# Patient Record
Sex: Female | Born: 1970 | Race: White | Hispanic: No | Marital: Married
Health system: Southern US, Community
[De-identification: ages and names within clinical notes are randomized; demographics above are authoritative.]

## PROBLEM LIST (undated history)

## (undated) NOTE — *Deleted (*Deleted)
  Problem: Education: Goal: Knowledge of risk factors and measures for prevention of condition will improve Outcome: Progressing   Problem: Education: Goal: Knowledge of risk factors and measures for prevention of condition will improve Outcome: Progressing   

---

## 1997-10-23 ENCOUNTER — Emergency Department (HOSPITAL_COMMUNITY): Admission: EM | Admit: 1997-10-23 | Discharge: 1997-10-23 | Payer: Self-pay | Admitting: Emergency Medicine

## 1998-12-28 ENCOUNTER — Emergency Department (HOSPITAL_COMMUNITY): Admission: EM | Admit: 1998-12-28 | Discharge: 1998-12-28 | Payer: Self-pay | Admitting: Emergency Medicine

## 2012-09-04 DIAGNOSIS — D649 Anemia, unspecified: Secondary | ICD-10-CM | POA: Insufficient documentation

## 2012-09-04 DIAGNOSIS — S42213A Unspecified displaced fracture of surgical neck of unspecified humerus, initial encounter for closed fracture: Secondary | ICD-10-CM | POA: Insufficient documentation

## 2012-09-04 DIAGNOSIS — G5600 Carpal tunnel syndrome, unspecified upper limb: Secondary | ICD-10-CM | POA: Insufficient documentation

## 2012-09-04 DIAGNOSIS — N946 Dysmenorrhea, unspecified: Secondary | ICD-10-CM | POA: Insufficient documentation

## 2012-09-04 DIAGNOSIS — S92919A Unspecified fracture of unspecified toe(s), initial encounter for closed fracture: Secondary | ICD-10-CM | POA: Insufficient documentation

## 2012-09-04 DIAGNOSIS — N201 Calculus of ureter: Secondary | ICD-10-CM | POA: Insufficient documentation

## 2019-08-17 ENCOUNTER — Ambulatory Visit: Payer: Medicaid Other | Admitting: Sports Medicine

## 2019-08-17 ENCOUNTER — Other Ambulatory Visit: Payer: Self-pay

## 2019-08-17 ENCOUNTER — Other Ambulatory Visit: Payer: Self-pay | Admitting: Sports Medicine

## 2019-08-17 ENCOUNTER — Encounter: Payer: Self-pay | Admitting: Sports Medicine

## 2019-08-17 DIAGNOSIS — S92102A Unspecified fracture of left talus, initial encounter for closed fracture: Secondary | ICD-10-CM

## 2019-08-17 DIAGNOSIS — M25572 Pain in left ankle and joints of left foot: Secondary | ICD-10-CM

## 2019-08-17 DIAGNOSIS — M79672 Pain in left foot: Secondary | ICD-10-CM

## 2019-08-17 DIAGNOSIS — R58 Hemorrhage, not elsewhere classified: Secondary | ICD-10-CM

## 2019-08-17 DIAGNOSIS — S92112A Displaced fracture of neck of left talus, initial encounter for closed fracture: Secondary | ICD-10-CM | POA: Diagnosis not present

## 2019-08-17 DIAGNOSIS — M79671 Pain in right foot: Secondary | ICD-10-CM

## 2019-08-17 DIAGNOSIS — R2681 Unsteadiness on feet: Secondary | ICD-10-CM

## 2019-08-17 MED ORDER — OXYCODONE-ACETAMINOPHEN 10-325 MG PO TABS: 1.0000 | ORAL_TABLET | Freq: Three times a day (TID) | ORAL | 0 refills | Status: DC | PRN

## 2019-08-17 NOTE — Progress Notes (Signed)
Subjective: Brianna Velasquez is a 49 y.o. female patient who presents to office for evaluation of Left ankle/foot pain. Patient complains of progressive pain especially over the last few days after falling on last Friday coming down a ramp, sine has been 10/10 sharp pain. Patient has tried icing, tramadol rx by PCP and icing with no relief in symptoms. Pain is worse at night, unable to sleep. Patient denies any other pedal complaints.   Review of Systems  All other systems reviewed and are negative.    Patient Active Problem List   Diagnosis Date Noted  . Anemia 09/04/2012  . Carpal tunnel syndrome 09/04/2012  . Closed fracture of phalanx of foot 09/04/2012  . Closed fracture of surgical neck of humerus 09/04/2012  . Dysmenorrhea 09/04/2012  . Morbid obesity (HCC) 09/04/2012  . Ureteric stone 09/04/2012    Current Outpatient Medications on File Prior to Visit  Medication Sig Dispense Refill  . ASPIRIN 81 PO Take by mouth.    Marland Kitchen atorvastatin (LIPITOR) 20 MG tablet Take 20 mg by mouth daily.    Marland Kitchen escitalopram (LEXAPRO) 10 MG tablet Take 10 mg by mouth daily.    Marland Kitchen ezetimibe (ZETIA) 10 MG tablet Take 10 mg by mouth daily.    Marland Kitchen ibuprofen (ADVIL) 800 MG tablet Take 800 mg by mouth every 8 (eight) hours as needed.    Marland Kitchen levothyroxine (SYNTHROID) 88 MCG tablet Take 88 mcg by mouth daily before breakfast.    . traMADol (ULTRAM) 50 MG tablet Take by mouth every 6 (six) hours as needed.     No current facility-administered medications on file prior to visit.    Not on File  Objective:  General: Alert and oriented x3 in no acute distress  Dermatology: No open lesions bilateral lower extremities, no webspace macerations, +ecchymosis left foot and ankle, all nails x 10 are well manicured.  Vascular: Focal edema noted to left foot/ankle. Dorsalis Pedis and Posterior Tibial pedal pulses 1/4, Capillary Fill Time 3 seconds,(+) pedal hair growth bilateral, Temperature gradient within normal  limits.  Neurology: Gross sensation intact via light touch bilateral, Protective sensation intact with Phoebe Perch Monofilament to all pedal sites. (-)Tinels sign left foot.   Musculoskeletal: There is tenderness with palpation at dorsal ankle on Left foot, There is pain with range of motion at left ankle. Strength difficult to test due to pain.  Gait: Cane assisted, Antalgic gait  X-ray left foot [facility] 07/19/2019 reveals healing first proximal phalangeal fracture and stable calcaneal spur compared to x-rays from the year prior Xray left ankle 08-15-19 avulsion fleck dorsal talus    Assessment and Plan: Problem List Items Addressed This Visit    None    Visit Diagnoses    Closed nondisplaced fracture of left talus, unspecified portion of talus, initial encounter    -  Primary   Ecchymosis       Acute left ankle pain       Left foot pain       Gait instability          -Complete examination performed -Xrays reports reviewed -Discussed treatment options for fracture; risks, alternatives, and benefits explained. -Engineer, mining to keep intact to left for 5 days -Dispensed CAM walker to patient to wear at all times and instructed on use -Recommend protection, rest, ice, elevation daily until symptoms improve -Rx Percocet to take since tramadol not effective -Continue with motrin in between doses of pain medication -Patient to return to office  in 2-3 weeks for serial x-rays to assess healing  or sooner if condition worsens.  Landis Martins, DPM

## 2019-08-24 ENCOUNTER — Other Ambulatory Visit: Payer: Self-pay | Admitting: Sports Medicine

## 2019-08-24 MED ORDER — OXYCODONE-ACETAMINOPHEN 10-325 MG PO TABS: 1.0000 | ORAL_TABLET | Freq: Three times a day (TID) | ORAL | 0 refills | Status: DC | PRN

## 2019-08-24 NOTE — Progress Notes (Signed)
Refilled pain medication for fracture -Dr. Kathie Rhodes

## 2019-08-30 ENCOUNTER — Other Ambulatory Visit: Payer: Self-pay

## 2019-08-30 ENCOUNTER — Ambulatory Visit (INDEPENDENT_AMBULATORY_CARE_PROVIDER_SITE_OTHER): Payer: Self-pay

## 2019-08-30 ENCOUNTER — Encounter: Payer: Self-pay | Admitting: Sports Medicine

## 2019-08-30 ENCOUNTER — Ambulatory Visit: Payer: Medicaid Other | Admitting: Sports Medicine

## 2019-08-30 ENCOUNTER — Other Ambulatory Visit: Payer: Self-pay | Admitting: Sports Medicine

## 2019-08-30 DIAGNOSIS — S86312S Strain of muscle(s) and tendon(s) of peroneal muscle group at lower leg level, left leg, sequela: Secondary | ICD-10-CM

## 2019-08-30 DIAGNOSIS — M79672 Pain in left foot: Secondary | ICD-10-CM

## 2019-08-30 DIAGNOSIS — S92102D Unspecified fracture of left talus, subsequent encounter for fracture with routine healing: Secondary | ICD-10-CM

## 2019-08-30 DIAGNOSIS — M779 Enthesopathy, unspecified: Secondary | ICD-10-CM

## 2019-08-30 DIAGNOSIS — R58 Hemorrhage, not elsewhere classified: Secondary | ICD-10-CM

## 2019-08-30 DIAGNOSIS — R2681 Unsteadiness on feet: Secondary | ICD-10-CM

## 2019-08-30 DIAGNOSIS — M25572 Pain in left ankle and joints of left foot: Secondary | ICD-10-CM

## 2019-08-30 MED ORDER — OXYCODONE-ACETAMINOPHEN 10-325 MG PO TABS: 1.0000 | ORAL_TABLET | Freq: Three times a day (TID) | ORAL | 0 refills | Status: DC | PRN

## 2019-08-30 NOTE — Progress Notes (Signed)
Subjective: Brianna Velasquez is a 49 y.o. female patient who presents to office for follow up evaluation of Left ankle/foot pain. Patient complains of continued pain in the ankle on the outside of the foot and at the top of the ankle. Patient reports that pain is sharp constant pain that is constant even with pain medication, motrin, and boot and ace wrap.  Reports that swelling is better but pain is not.  Patient denies any other pedal complaints.   Patient Active Problem List   Diagnosis Date Noted  . Anemia 09/04/2012  . Carpal tunnel syndrome 09/04/2012  . Closed fracture of phalanx of foot 09/04/2012  . Closed fracture of surgical neck of humerus 09/04/2012  . Dysmenorrhea 09/04/2012  . Morbid obesity (HCC) 09/04/2012  . Ureteric stone 09/04/2012    Current Outpatient Medications on File Prior to Visit  Medication Sig Dispense Refill  . ASPIRIN 81 PO Take by mouth.    Marland Kitchen atorvastatin (LIPITOR) 20 MG tablet Take 20 mg by mouth daily.    Marland Kitchen escitalopram (LEXAPRO) 10 MG tablet Take 10 mg by mouth daily.    Marland Kitchen ezetimibe (ZETIA) 10 MG tablet Take 10 mg by mouth daily.    Marland Kitchen ibuprofen (ADVIL) 800 MG tablet Take 800 mg by mouth every 8 (eight) hours as needed.    Marland Kitchen levothyroxine (SYNTHROID) 88 MCG tablet Take 88 mcg by mouth daily before breakfast.    . oxyCODONE-acetaminophen (PERCOCET) 10-325 MG tablet Take 1 tablet by mouth every 8 (eight) hours as needed for up to 7 days for pain. 21 tablet 0  . traMADol (ULTRAM) 50 MG tablet Take by mouth every 6 (six) hours as needed.     No current facility-administered medications on file prior to visit.    Not on File  Objective:  General: Alert and oriented x3 in no acute distress  Dermatology: No open lesions bilateral lower extremities, no webspace macerations, + decreased ecchymosis left foot and ankle, all nails x 10 are well manicured.  Vascular: Focal edema noted to left foot/ankle. Dorsalis Pedis and Posterior Tibial pedal pulses 1/4,  Capillary Fill Time 3 seconds,(+) pedal hair growth bilateral, Temperature gradient within normal limits.  Neurology: Gross sensation intact via light touch bilateral, Protective sensation intact with Phoebe Perch Monofilament to all pedal sites. (-)Tinels sign left foot.   Musculoskeletal: There is tenderness with palpation at dorsal and lateral ankle on Left foot, There is pain with range of motion at left ankle. Strength difficult to test due to pain and soft tissue swelling.  Gait: Cane assisted, Antalgic gait  Xray left ankle avulsion fleck dorsal talus, significant soft tissue swelling especially lateral ankle.  No other acute findings.     Assessment and Plan: Problem List Items Addressed This Visit    None    Visit Diagnoses    Closed nondisplaced fracture of left talus with routine healing, unspecified fracture morphology, subsequent encounter    -  Primary   Peroneal tendon tear, left, sequela       Ecchymosis       Acute left ankle pain       Left foot pain       Gait instability          -Complete examination performed -Xrays reviewed -Re-Discussed treatment options for fracture with now even possible peroneal tendon involvement versus tear; risks, alternatives, and benefits explained. -Ordered MRI to rule out tear in the setting of constant pain with history of fall -Continue with cam boot  and cane -Dispensed surgitube compression sleeve for edema control -Refill Percocet for patient to take for more severe episodes of pain to pick up on tomorrow -Continue with motrin in between doses of pain medication -Patient to return to office after MRI or sooner if problems or issues arise.  Landis Martins, DPM

## 2019-08-31 ENCOUNTER — Telehealth: Payer: Self-pay

## 2019-08-31 ENCOUNTER — Telehealth: Payer: Self-pay | Admitting: *Deleted

## 2019-08-31 DIAGNOSIS — M25572 Pain in left ankle and joints of left foot: Secondary | ICD-10-CM

## 2019-08-31 DIAGNOSIS — S92102D Unspecified fracture of left talus, subsequent encounter for fracture with routine healing: Secondary | ICD-10-CM

## 2019-08-31 DIAGNOSIS — R2681 Unsteadiness on feet: Secondary | ICD-10-CM

## 2019-08-31 DIAGNOSIS — S86312S Strain of muscle(s) and tendon(s) of peroneal muscle group at lower leg level, left leg, sequela: Secondary | ICD-10-CM

## 2019-08-31 NOTE — Telephone Encounter (Signed)
-----   Message from [place], [place] sent at 08/30/2019 10:24 PM EDT ----- Regarding: MRI Left anlke R/o tear at peroneal tendon and evaluate extent of talus fracture, continued pain in ankle after a fall injury

## 2019-08-31 NOTE — Telephone Encounter (Signed)
Same chip fracture at the front of the ankle that she had before and some swelling of the ankle

## 2019-08-31 NOTE — Telephone Encounter (Signed)
Pt called and LVM  Requesting to know what exactly did the doctor saw on her xrays taken yesterday afternoon. Pt states she forgot what the Dr. Charline Bills.

## 2019-09-01 NOTE — Telephone Encounter (Signed)
Pt was advised that she has the same chip fx at the ankle and swelling and that it's too early to tel if it's healing, that her MRI will confirm if it's healing. Pt stated understanding

## 2019-09-01 NOTE — Telephone Encounter (Signed)
[employer] - Medicaid requires clinicals for review prior to pre-cert for MRI 79987 left ankle, Case:  [id number]. Faxed orders, clinicals and demographics to [employer].

## 2019-09-06 ENCOUNTER — Other Ambulatory Visit: Payer: Self-pay | Admitting: Sports Medicine

## 2019-09-06 DIAGNOSIS — S92102D Unspecified fracture of left talus, subsequent encounter for fracture with routine healing: Secondary | ICD-10-CM

## 2019-09-06 MED ORDER — OXYCODONE-ACETAMINOPHEN 10-325 MG PO TABS: 1.0000 | ORAL_TABLET | Freq: Three times a day (TID) | ORAL | 0 refills | Status: AC | PRN

## 2019-09-06 NOTE — Progress Notes (Signed)
Refilled pain medication for pain related to facture

## 2019-09-14 ENCOUNTER — Telehealth: Payer: Self-pay | Admitting: Sports Medicine

## 2019-09-14 ENCOUNTER — Other Ambulatory Visit: Payer: Self-pay | Admitting: Sports Medicine

## 2019-09-14 DIAGNOSIS — S92102D Unspecified fracture of left talus, subsequent encounter for fracture with routine healing: Secondary | ICD-10-CM

## 2019-09-14 MED ORDER — OXYCODONE-ACETAMINOPHEN 10-325 MG PO TABS: 1.0000 | ORAL_TABLET | Freq: Three times a day (TID) | ORAL | 0 refills | Status: AC | PRN

## 2019-09-14 NOTE — Telephone Encounter (Signed)
Sent pain medication but please follow up on her MRI. She hasn't heard anything yet Thanks Dr. Marylene Land

## 2019-09-14 NOTE — Telephone Encounter (Signed)
Pt req refill for oxycodone [facility] [place].  Pt has not heard back about MRI scheduling yet.

## 2019-09-14 NOTE — Progress Notes (Signed)
Refilled Percocet  -Dr Marylene Land

## 2019-09-15 NOTE — Telephone Encounter (Signed)
[employer] - MEDICAID AUTHORIZATION:  [id number] FOR MRI LEFT [id number], VALID: 09/01/2019 END DATE: 02/28/2020 FOR CASE:  [id number]. [facility] Antony Contras scheduled [id number] left MRI for 09/19/2019 arrive at 7:15am for 7:30am imaging. Faxed to [facility].

## 2019-09-15 NOTE — Telephone Encounter (Signed)
Unable to leave a message home phone mail box is not set up. Left message with pt's mtr to call for her appt.

## 2019-09-18 NOTE — Telephone Encounter (Signed)
I informed pt of [facility] appt 09/19/2019.

## 2019-09-20 ENCOUNTER — Other Ambulatory Visit: Payer: Self-pay | Admitting: Sports Medicine

## 2019-09-20 ENCOUNTER — Telehealth: Payer: Self-pay | Admitting: Sports Medicine

## 2019-09-20 DIAGNOSIS — S92102D Unspecified fracture of left talus, subsequent encounter for fracture with routine healing: Secondary | ICD-10-CM

## 2019-09-20 MED ORDER — OXYCODONE-ACETAMINOPHEN 10-325 MG PO TABS: 1.0000 | ORAL_TABLET | Freq: Three times a day (TID) | ORAL | 0 refills | Status: AC | PRN

## 2019-09-20 NOTE — Progress Notes (Signed)
Refilled pain medication

## 2019-09-20 NOTE — Telephone Encounter (Signed)
Pt called and would like a refill on her oxyCODONE-acetaminophen. Please advise.

## 2019-09-20 NOTE — Telephone Encounter (Signed)
FYI Refill has been sent to her pharmacy  -Dr. Kathie Rhodes

## 2019-09-26 ENCOUNTER — Telehealth: Payer: Self-pay | Admitting: *Deleted

## 2019-09-26 NOTE — Telephone Encounter (Signed)
Patient is requesting her MRI results that were completed 1 week ago.  Please call.

## 2019-09-28 ENCOUNTER — Ambulatory Visit: Payer: Medicaid Other | Admitting: Sports Medicine

## 2019-09-28 ENCOUNTER — Encounter: Payer: Self-pay | Admitting: Sports Medicine

## 2019-09-28 ENCOUNTER — Other Ambulatory Visit: Payer: Self-pay

## 2019-09-28 DIAGNOSIS — M79672 Pain in left foot: Secondary | ICD-10-CM

## 2019-09-28 DIAGNOSIS — S92102D Unspecified fracture of left talus, subsequent encounter for fracture with routine healing: Secondary | ICD-10-CM

## 2019-09-28 DIAGNOSIS — M722 Plantar fascial fibromatosis: Secondary | ICD-10-CM | POA: Diagnosis not present

## 2019-09-28 DIAGNOSIS — M779 Enthesopathy, unspecified: Secondary | ICD-10-CM | POA: Diagnosis not present

## 2019-09-28 DIAGNOSIS — S86312S Strain of muscle(s) and tendon(s) of peroneal muscle group at lower leg level, left leg, sequela: Secondary | ICD-10-CM

## 2019-09-28 DIAGNOSIS — M25572 Pain in left ankle and joints of left foot: Secondary | ICD-10-CM

## 2019-09-28 MED ORDER — PREDNISONE 10 MG (21) PO TBPK
ORAL_TABLET | ORAL | 0 refills | Status: DC
Start: 2019-09-28 — End: 2019-10-12

## 2019-09-28 MED ORDER — OXYCODONE-ACETAMINOPHEN 10-325 MG PO TABS: 1.0000 | ORAL_TABLET | Freq: Three times a day (TID) | ORAL | 0 refills | Status: AC | PRN

## 2019-09-28 NOTE — Progress Notes (Signed)
Subjective: Brianna Velasquez is a 49 y.o. female patient who presents to office for follow up evaluation of Left ankle/foot pain. Patient complains of continued pain in the ankle and foot but does seem like it is getting a little better pain is worse still at night 7 out of 10 and after standing for a long period of time can stand about an hour before she has swelling and pain that is relieved with her Percocet I think and elevating.  Patient denies any other pedal complaints.   Patient Active Problem List   Diagnosis Date Noted  . Anemia 09/04/2012  . Carpal tunnel syndrome 09/04/2012  . Closed fracture of phalanx of foot 09/04/2012  . Closed fracture of surgical neck of humerus 09/04/2012  . Dysmenorrhea 09/04/2012  . Morbid obesity ([place]) 09/04/2012  . Ureteric stone 09/04/2012    Current Outpatient Medications on File Prior to Visit  Medication Sig Dispense Refill  . ASPIRIN 81 PO Take by mouth.    Marland Kitchen atorvastatin (LIPITOR) 20 MG tablet Take 20 mg by mouth daily.    Marland Kitchen escitalopram (LEXAPRO) 10 MG tablet Take 10 mg by mouth daily.    Marland Kitchen ezetimibe (ZETIA) 10 MG tablet Take 10 mg by mouth daily.    Marland Kitchen ibuprofen (ADVIL) 800 MG tablet Take 800 mg by mouth every 8 (eight) hours as needed.    Marland Kitchen levothyroxine (SYNTHROID) 88 MCG tablet Take 88 mcg by mouth daily before breakfast.    . traMADol (ULTRAM) 50 MG tablet Take by mouth every 6 (six) hours as needed.     No current facility-administered medications on file prior to visit.    Not on File  Objective:  General: Alert and oriented x3 in no acute distress  Dermatology: No open lesions bilateral lower extremities, no webspace macerations, all nails x 10 are well manicured.  Vascular: Decreased edema noted to left foot/ankle. Dorsalis Pedis and Posterior Tibial pedal pulses 1/4, Capillary Fill Time 3 seconds,(+) pedal hair growth bilateral, Temperature gradient within normal limits.  Neurology: Gross sensation intact via light touch  bilateral, Protective sensation intact with Thornell Mule Monofilament to all pedal sites. (-)Tinels sign left foot.   Musculoskeletal: There is decreased tenderness with palpation at dorsal and lateral ankle on Left foot, there is minimal pain to palpation at medial lateral ankle and posterior heel at Achilles insertion, there is decreased pain with range of motion at left ankle.  Gait: Cane assisted, Antalgic gait with cam boot  MRI reviewed consistent with old or healed avulsion fracture at the talus I discussed this case with the radiologist who confirmed that on her MRI that there was no marrow edema at the ankle at the level of the talus that suggests any ongoing healing of fracture at this area appears to have previous avulsion fracture that has healed however there is areas of significant tendinitis and partial tearing no full-thickness tearing at Achilles tendon and at peroneal tendon course on the left foot and ankle.  Assessment and Plan: Problem List Items Addressed This Visit    None    Visit Diagnoses    Closed nondisplaced fracture of left talus with routine healing, unspecified fracture morphology, subsequent encounter    -  Primary   Peroneal tendon tear, left, sequela       Tendinitis       Plantar fasciitis       Left foot pain       Acute left ankle pain          -  Complete examination performed -MRI results reviewed -Re-Discussed treatment options  -Advised patient to continue with cam boot -Dispensed prednisone for patient to take for inflammation along tendon areas -Dispensed surgitube compression sleeve for edema control -Refill Percocet for patient to have for severe pain patient will be out over the weekend so the earliest patient can pick up the medication is on Sunday -Continue with motrin in between doses of pain medication as needed except when she is on the prednisone but she use Tylenol -Patient to return to office in 2 weeks or sooner if problems or  issues arise.  Advised patient in 2 weeks if she is feeling better may consider transitioning her out of the cam boot  Asencion Islam, DPM

## 2019-10-04 ENCOUNTER — Encounter: Payer: Self-pay | Admitting: Sports Medicine

## 2019-10-09 ENCOUNTER — Other Ambulatory Visit: Payer: Self-pay | Admitting: Sports Medicine

## 2019-10-09 ENCOUNTER — Telehealth: Payer: Self-pay | Admitting: Urology

## 2019-10-09 MED ORDER — OXYCODONE-ACETAMINOPHEN 10-325 MG PO TABS: 1.0000 | ORAL_TABLET | Freq: Three times a day (TID) | ORAL | 0 refills | Status: DC | PRN

## 2019-10-09 NOTE — Telephone Encounter (Signed)
Sent!

## 2019-10-09 NOTE — Telephone Encounter (Signed)
Pt would like a refill on pain medicine. Please advise. Thanks

## 2019-10-09 NOTE — Progress Notes (Signed)
Refilled pain medication

## 2019-10-12 ENCOUNTER — Other Ambulatory Visit: Payer: Self-pay

## 2019-10-12 ENCOUNTER — Encounter: Payer: Self-pay | Admitting: Sports Medicine

## 2019-10-12 ENCOUNTER — Ambulatory Visit: Payer: Medicaid Other | Admitting: Sports Medicine

## 2019-10-12 DIAGNOSIS — M25572 Pain in left ankle and joints of left foot: Secondary | ICD-10-CM

## 2019-10-12 DIAGNOSIS — M7751 Other enthesopathy of right foot: Secondary | ICD-10-CM

## 2019-10-12 DIAGNOSIS — S92102D Unspecified fracture of left talus, subsequent encounter for fracture with routine healing: Secondary | ICD-10-CM

## 2019-10-12 DIAGNOSIS — M722 Plantar fascial fibromatosis: Secondary | ICD-10-CM

## 2019-10-12 DIAGNOSIS — M79672 Pain in left foot: Secondary | ICD-10-CM

## 2019-10-12 DIAGNOSIS — M779 Enthesopathy, unspecified: Secondary | ICD-10-CM

## 2019-10-12 MED ORDER — TRIAMCINOLONE ACETONIDE 10 MG/ML IJ SUSP
10.0000 mg | Freq: Once | INTRAMUSCULAR | Status: AC
  Administered 2019-10-12: 10 mg

## 2019-10-12 MED ORDER — OXYCODONE-ACETAMINOPHEN 10-325 MG PO TABS: 1.0000 | ORAL_TABLET | Freq: Three times a day (TID) | ORAL | 0 refills | Status: AC | PRN

## 2019-10-12 NOTE — Progress Notes (Signed)
Subjective: Brianna Velasquez is a 49 y.o. female patient who presents to office for follow up evaluation of Left ankle/foot pain. Patient complains of continued pain in the ankle and foot worse at the back and the sides of the ankle reports that the swelling is good unless she is on it with still pain worse at night reports that none of the medicines have helped but the boot seems to help the most as well as elevating and taking her pain medicine.  Patient denies any other pedal complaints.   Patient Active Problem List   Diagnosis Date Noted  . Anemia 09/04/2012  . Carpal tunnel syndrome 09/04/2012  . Closed fracture of phalanx of foot 09/04/2012  . Closed fracture of surgical neck of humerus 09/04/2012  . Dysmenorrhea 09/04/2012  . Morbid obesity (HCC) 09/04/2012  . Ureteric stone 09/04/2012    Current Outpatient Medications on File Prior to Visit  Medication Sig Dispense Refill  . ASPIRIN 81 PO Take by mouth.    Marland Kitchen atorvastatin (LIPITOR) 20 MG tablet Take 20 mg by mouth daily.    Marland Kitchen escitalopram (LEXAPRO) 10 MG tablet Take 10 mg by mouth daily.    Marland Kitchen ezetimibe (ZETIA) 10 MG tablet Take 10 mg by mouth daily.    . hydrOXYzine (ATARAX/VISTARIL) 25 MG tablet Take 25 mg by mouth daily as needed.    Marland Kitchen ibuprofen (ADVIL) 800 MG tablet Take 800 mg by mouth every 8 (eight) hours as needed.    Marland Kitchen levothyroxine (SYNTHROID) 88 MCG tablet Take 88 mcg by mouth daily before breakfast.    . traMADol (ULTRAM) 50 MG tablet Take by mouth every 6 (six) hours as needed.     No current facility-administered medications on file prior to visit.    Not on File  Objective:  General: Alert and oriented x3 in no acute distress  Dermatology: No open lesions bilateral lower extremities, no webspace macerations, all nails x 10 are well manicured.  Vascular: Decreased edema noted to left foot/ankle. Dorsalis Pedis and Posterior Tibial pedal pulses 1/4, Capillary Fill Time 3 seconds,(+) pedal hair growth bilateral,  Temperature gradient within normal limits.  Neurology: Gross sensation intact via light touch bilateral, Protective sensation intact with Phoebe Perch Monofilament to all pedal sites. (-)Tinels sign left foot.   Musculoskeletal: There is decreased tenderness with palpation at dorsal and lateral ankle on Left foot, there is pain to palpation at medial lateral ankle and posterior heel at Achilles insertion worse today at the lateral foot and ankle, there is decreased pain with range of motion at left ankle.  Gait: Cane assisted, Antalgic gait with cam boot  Assessment and Plan: Problem List Items Addressed This Visit    None    Visit Diagnoses    Tendinitis    -  Primary   Relevant Medications   triamcinolone acetonide (KENALOG) 10 MG/ML injection 10 mg (Start on 10/12/2019  8:15 PM)   Plantar fasciitis       Acute left ankle pain       Left foot pain       Closed nondisplaced fracture of left talus with routine healing, unspecified fracture morphology, subsequent encounter          -Complete examination performed -Re-Discussed treatment options  -After oral consent and aseptic prep, injected a mixture containing 1 ml of 2%  plain lidocaine, 1 ml 0.5% plain marcaine, 0.5 ml of kenalog 10 and 0.5 ml of dexamethasone phosphate into left lateral ankle without complication. Post-injection care discussed  with patient.  -Advised patient to continue with cam boot until she can afford to get OTC Trilock brace and surgitube compression sleeve for edema control -Refilled Percocet for pick up on Monday -Continue with motrin in between doses of pain medication as needed -Patient to call office if no better after 1 week for PT referral. 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

## 2019-10-23 ENCOUNTER — Other Ambulatory Visit: Payer: Self-pay | Admitting: Sports Medicine

## 2019-10-23 ENCOUNTER — Telehealth: Payer: Self-pay | Admitting: Urology

## 2019-10-23 DIAGNOSIS — M779 Enthesopathy, unspecified: Secondary | ICD-10-CM

## 2019-10-23 DIAGNOSIS — M79672 Pain in left foot: Secondary | ICD-10-CM

## 2019-10-23 DIAGNOSIS — M25572 Pain in left ankle and joints of left foot: Secondary | ICD-10-CM

## 2019-10-23 DIAGNOSIS — M722 Plantar fascial fibromatosis: Secondary | ICD-10-CM

## 2019-10-23 MED ORDER — OXYCODONE-ACETAMINOPHEN 7.5-325 MG PO TABS: 1.0000 | ORAL_TABLET | Freq: Three times a day (TID) | ORAL | 0 refills | Status: AC | PRN

## 2019-10-23 NOTE — Telephone Encounter (Signed)
Pt is asking for a refill on pain medicine and pt would like to start PT.

## 2019-10-23 NOTE — Telephone Encounter (Signed)
Refilled pain medication For PT, will you lay an Rx for [place] on my desk and I will fill it out on Wednesday for you to fax with my last office note -Dr. Kathie Rhodes

## 2019-10-23 NOTE — Progress Notes (Signed)
Refilled pain medication. Lowered the strength from 10 to 7/5/325 of percocet

## 2019-10-31 ENCOUNTER — Telehealth: Payer: Self-pay | Admitting: Sports Medicine

## 2019-10-31 NOTE — Telephone Encounter (Signed)
Pt called requesting a refill of pain medication.  Pt would also like a call to discuss why the dosage was decreased. Pt states the new dosage doesn't seem to be helping as much.

## 2019-11-06 ENCOUNTER — Other Ambulatory Visit: Payer: Self-pay | Admitting: Sports Medicine

## 2019-11-06 ENCOUNTER — Telehealth: Payer: Self-pay

## 2019-11-06 MED ORDER — HYDROCODONE-ACETAMINOPHEN 7.5-325 MG PO TABS: 1.0000 | ORAL_TABLET | Freq: Four times a day (QID) | ORAL | 0 refills | Status: AC | PRN

## 2019-11-06 NOTE — Telephone Encounter (Signed)
Pain medication was refilled. Have to taper down dose to wean her off of the pain medication. If she is having significant pain and can not tolerate me weaning her off the medication she will have to go see a pain management doctor -Dr. Kathie Rhodes

## 2019-11-06 NOTE — Telephone Encounter (Signed)
Pt called and LVM on the nurse line last week  Requesting a RF on her percocet to [facility] on [address].  (I was out of office that whole week)

## 2019-11-06 NOTE — Progress Notes (Signed)
Pain med refill provided

## 2019-11-07 NOTE — Telephone Encounter (Signed)
Called and spoke with pt about her pain medication RF, and advised her of Dr. Wynema Birch instructions. Pt stated understanding

## 2019-11-08 ENCOUNTER — Encounter: Payer: Self-pay | Admitting: Sports Medicine

## 2019-11-08 ENCOUNTER — Other Ambulatory Visit: Payer: Self-pay

## 2019-11-08 ENCOUNTER — Ambulatory Visit (INDEPENDENT_AMBULATORY_CARE_PROVIDER_SITE_OTHER): Payer: Medicaid Other | Admitting: Sports Medicine

## 2019-11-08 DIAGNOSIS — M779 Enthesopathy, unspecified: Secondary | ICD-10-CM

## 2019-11-08 DIAGNOSIS — M722 Plantar fascial fibromatosis: Secondary | ICD-10-CM

## 2019-11-08 DIAGNOSIS — M25572 Pain in left ankle and joints of left foot: Secondary | ICD-10-CM

## 2019-11-08 DIAGNOSIS — R2681 Unsteadiness on feet: Secondary | ICD-10-CM

## 2019-11-08 DIAGNOSIS — S92102D Unspecified fracture of left talus, subsequent encounter for fracture with routine healing: Secondary | ICD-10-CM

## 2019-11-08 DIAGNOSIS — M79672 Pain in left foot: Secondary | ICD-10-CM

## 2019-11-08 NOTE — Progress Notes (Signed)
Subjective: Brianna Velasquez is a 49 y.o. female patient who returns to office for follow up evaluation of Left ankle/foot pain. Patient reports that pain is still the same mostly at the back of the heel and on the lateral side of the foot sharp in nature worse in the evenings with swelling 8 out of 10 reports relief with pain medication and still using cam boot.  Patient denies any other pedal complaints.   Patient Active Problem List   Diagnosis Date Noted  . Anemia 09/04/2012  . Carpal tunnel syndrome 09/04/2012  . Closed fracture of phalanx of foot 09/04/2012  . Closed fracture of surgical neck of humerus 09/04/2012  . Dysmenorrhea 09/04/2012  . Morbid obesity (HCC) 09/04/2012  . Ureteric stone 09/04/2012    Current Outpatient Medications on File Prior to Visit  Medication Sig Dispense Refill  . ASPIRIN 81 PO Take by mouth.    Marland Kitchen atorvastatin (LIPITOR) 20 MG tablet Take 20 mg by mouth daily.    Marland Kitchen escitalopram (LEXAPRO) 10 MG tablet Take 10 mg by mouth daily.    Marland Kitchen ezetimibe (ZETIA) 10 MG tablet Take 10 mg by mouth daily.    Marland Kitchen HYDROcodone-acetaminophen (NORCO) 7.5-325 MG tablet Take 1 tablet by mouth every 6 (six) hours as needed for up to 5 days for moderate pain. 20 tablet 0  . hydrOXYzine (ATARAX/VISTARIL) 25 MG tablet Take 25 mg by mouth daily as needed.    Marland Kitchen ibuprofen (ADVIL) 800 MG tablet Take 800 mg by mouth every 8 (eight) hours as needed.    Marland Kitchen levothyroxine (SYNTHROID) 88 MCG tablet Take 88 mcg by mouth daily before breakfast.    . traMADol (ULTRAM) 50 MG tablet Take by mouth every 6 (six) hours as needed.     No current facility-administered medications on file prior to visit.    Not on File  Objective:  General: Alert and oriented x3 in no acute distress  Dermatology: No open lesions bilateral lower extremities, no webspace macerations, all nails x 10 are well manicured.  Vascular: Decreased edema noted to left foot/ankle. Dorsalis Pedis and Posterior Tibial pedal  pulses 1/4, Capillary Fill Time 3 seconds,(+) pedal hair growth bilateral, Temperature gradient within normal limits.  Neurology: Gross sensation intact via light touch bilateral, Protective sensation intact with Phoebe Perch Monofilament to all pedal sites. (-)Tinels sign left foot.   Musculoskeletal: There is pain to palpation over the dorsal lateral foot and ankle at the peroneal tendon course and posterior heel at Achilles insertion worse today at the heel, there is decreased pain with range of motion at left ankle.  Gait: Cane assisted, Antalgic gait with cam boot  Assessment and Plan: Problem List Items Addressed This Visit    None    Visit Diagnoses    Tendinitis    -  Primary   Plantar fasciitis       Acute left ankle pain       Left foot pain       Closed nondisplaced fracture of left talus with routine healing, unspecified fracture morphology, subsequent encounter       Gait instability          -Complete examination performed -Re-Discussed treatment options for ongoing pain -Patient to start physical therapy today -Referral made to pain management -Advised patient to continue with cam boot and compression sleeve for edema control -Patient to follow-up in 1 month for last courtesy check since her insurance has changed  Asencion Islam, DPM

## 2019-11-09 ENCOUNTER — Telehealth: Payer: Self-pay | Admitting: *Deleted

## 2019-11-09 DIAGNOSIS — M25572 Pain in left ankle and joints of left foot: Secondary | ICD-10-CM

## 2019-11-09 DIAGNOSIS — S92102D Unspecified fracture of left talus, subsequent encounter for fracture with routine healing: Secondary | ICD-10-CM

## 2019-11-09 DIAGNOSIS — M722 Plantar fascial fibromatosis: Secondary | ICD-10-CM

## 2019-11-09 DIAGNOSIS — M779 Enthesopathy, unspecified: Secondary | ICD-10-CM

## 2019-11-09 NOTE — Telephone Encounter (Signed)
Faxed referral, clinicals, and demographics to [facility].

## 2019-11-09 NOTE — Telephone Encounter (Signed)
-----   Message from [place], [place] sent at 11/08/2019  9:08 PM EDT ----- Regarding: Refer to pain mgt Refer to pain management patient has a history of fracture that has healed with ongoing tendinitis requiring pain medication has been on medication for this condition greater than 2 months and is having difficulties tapering off the pain medication

## 2019-11-14 ENCOUNTER — Other Ambulatory Visit: Payer: Self-pay | Admitting: Sports Medicine

## 2019-11-14 ENCOUNTER — Telehealth: Payer: Self-pay | Admitting: Urology

## 2019-11-14 DIAGNOSIS — M779 Enthesopathy, unspecified: Secondary | ICD-10-CM

## 2019-11-14 MED ORDER — HYDROCODONE-ACETAMINOPHEN 5-325 MG PO TABS: 1.0000 | ORAL_TABLET | Freq: Four times a day (QID) | ORAL | 0 refills | Status: AC | PRN

## 2019-11-14 NOTE — Progress Notes (Signed)
Sent Norco for pain until she can see pain mgt

## 2019-11-14 NOTE — Telephone Encounter (Signed)
Pt called and would like pain meds refilled. Please advise

## 2019-11-14 NOTE — Telephone Encounter (Signed)
Refill sent.

## 2019-11-14 NOTE — Telephone Encounter (Signed)
Unable to find [facility] centers. Faxed referral, clinicals and demographics to [facility].

## 2019-11-14 NOTE — Telephone Encounter (Signed)
[facility] states they are not in network with pt's insurance.

## 2019-11-15 ENCOUNTER — Encounter: Payer: Self-pay | Admitting: Physical Medicine and Rehabilitation

## 2019-11-20 ENCOUNTER — Telehealth: Payer: Self-pay | Admitting: Sports Medicine

## 2019-11-20 ENCOUNTER — Other Ambulatory Visit: Payer: Self-pay | Admitting: Sports Medicine

## 2019-11-20 MED ORDER — HYDROCODONE-ACETAMINOPHEN 5-325 MG PO TABS: 1.0000 | ORAL_TABLET | Freq: Four times a day (QID) | ORAL | 0 refills | Status: AC | PRN

## 2019-11-20 NOTE — Telephone Encounter (Signed)
Sent!

## 2019-11-20 NOTE — Telephone Encounter (Signed)
Pt called requesting pain medication refill

## 2019-11-20 NOTE — Progress Notes (Signed)
Refilled pain medication -Dr. S 

## 2019-11-28 ENCOUNTER — Telehealth: Payer: Self-pay | Admitting: Sports Medicine

## 2019-11-28 NOTE — Telephone Encounter (Signed)
Pt called requesting refill of Hydrocodone.   Please send to [facility] in [place]

## 2019-11-29 ENCOUNTER — Other Ambulatory Visit: Payer: Self-pay | Admitting: Sports Medicine

## 2019-11-29 ENCOUNTER — Ambulatory Visit: Payer: Medicaid Other | Admitting: Sports Medicine

## 2019-11-29 DIAGNOSIS — M79672 Pain in left foot: Secondary | ICD-10-CM

## 2019-11-29 MED ORDER — HYDROCODONE-ACETAMINOPHEN 5-325 MG PO TABS: 1.0000 | ORAL_TABLET | Freq: Four times a day (QID) | ORAL | 0 refills | Status: AC | PRN

## 2019-11-29 NOTE — Telephone Encounter (Signed)
Called pt to advise her of her pain med sent to pharmacy

## 2019-11-29 NOTE — Progress Notes (Signed)
Refilled pain medication until patient can see pain management

## 2019-11-29 NOTE — Telephone Encounter (Signed)
Sent!

## 2019-11-29 NOTE — Telephone Encounter (Signed)
Dr. Stover please advice 

## 2019-12-06 ENCOUNTER — Ambulatory Visit: Payer: Medicaid Other | Admitting: Sports Medicine

## 2019-12-07 ENCOUNTER — Telehealth: Payer: Self-pay | Admitting: Sports Medicine

## 2019-12-07 NOTE — Telephone Encounter (Signed)
Patient called for refill on pain medication. Patient was referred to pain management and must follow up with them for further pain med refills -Dr Marylene Land

## 2019-12-20 ENCOUNTER — Encounter: Payer: Medicaid Other | Admitting: Physical Medicine and Rehabilitation

## 2020-02-11 ENCOUNTER — Emergency Department (HOSPITAL_COMMUNITY): Payer: Medicaid Other

## 2020-02-11 ENCOUNTER — Inpatient Hospital Stay (HOSPITAL_COMMUNITY)
Admission: EM | Admit: 2020-02-11 | Discharge: 2020-03-27 | DRG: 208 | Disposition: E | Payer: Medicaid Other | Attending: Pulmonary Disease | Admitting: Pulmonary Disease

## 2020-02-11 DIAGNOSIS — I2699 Other pulmonary embolism without acute cor pulmonale: Secondary | ICD-10-CM | POA: Diagnosis present

## 2020-02-11 DIAGNOSIS — A4101 Sepsis due to Methicillin susceptible Staphylococcus aureus: Secondary | ICD-10-CM | POA: Diagnosis present

## 2020-02-11 DIAGNOSIS — R0902 Hypoxemia: Secondary | ICD-10-CM

## 2020-02-11 DIAGNOSIS — Y92239 Unspecified place in hospital as the place of occurrence of the external cause: Secondary | ICD-10-CM | POA: Diagnosis present

## 2020-02-11 DIAGNOSIS — Z9981 Dependence on supplemental oxygen: Secondary | ICD-10-CM

## 2020-02-11 DIAGNOSIS — Z7982 Long term (current) use of aspirin: Secondary | ICD-10-CM | POA: Diagnosis not present

## 2020-02-11 DIAGNOSIS — E039 Hypothyroidism, unspecified: Secondary | ICD-10-CM | POA: Diagnosis present

## 2020-02-11 DIAGNOSIS — I469 Cardiac arrest, cause unspecified: Secondary | ICD-10-CM | POA: Diagnosis not present

## 2020-02-11 DIAGNOSIS — R0602 Shortness of breath: Secondary | ICD-10-CM | POA: Diagnosis present

## 2020-02-11 DIAGNOSIS — J1282 Pneumonia due to coronavirus disease 2019: Secondary | ICD-10-CM | POA: Diagnosis present

## 2020-02-11 DIAGNOSIS — F32A Depression, unspecified: Secondary | ICD-10-CM | POA: Diagnosis present

## 2020-02-11 DIAGNOSIS — Z66 Do not resuscitate: Secondary | ICD-10-CM | POA: Diagnosis not present

## 2020-02-11 DIAGNOSIS — R34 Anuria and oliguria: Secondary | ICD-10-CM | POA: Diagnosis not present

## 2020-02-11 DIAGNOSIS — E785 Hyperlipidemia, unspecified: Secondary | ICD-10-CM | POA: Diagnosis present

## 2020-02-11 DIAGNOSIS — Z4659 Encounter for fitting and adjustment of other gastrointestinal appliance and device: Secondary | ICD-10-CM

## 2020-02-11 DIAGNOSIS — R739 Hyperglycemia, unspecified: Secondary | ICD-10-CM | POA: Diagnosis not present

## 2020-02-11 DIAGNOSIS — T80212A Local infection due to central venous catheter, initial encounter: Secondary | ICD-10-CM

## 2020-02-11 DIAGNOSIS — F17210 Nicotine dependence, cigarettes, uncomplicated: Secondary | ICD-10-CM | POA: Diagnosis present

## 2020-02-11 DIAGNOSIS — E875 Hyperkalemia: Secondary | ICD-10-CM | POA: Diagnosis not present

## 2020-02-11 DIAGNOSIS — U071 COVID-19: Secondary | ICD-10-CM | POA: Diagnosis present

## 2020-02-11 DIAGNOSIS — Z23 Encounter for immunization: Secondary | ICD-10-CM

## 2020-02-11 DIAGNOSIS — R6521 Severe sepsis with septic shock: Secondary | ICD-10-CM | POA: Diagnosis not present

## 2020-02-11 DIAGNOSIS — Y95 Nosocomial condition: Secondary | ICD-10-CM | POA: Diagnosis present

## 2020-02-11 DIAGNOSIS — J15211 Pneumonia due to Methicillin susceptible Staphylococcus aureus: Secondary | ICD-10-CM | POA: Diagnosis present

## 2020-02-11 DIAGNOSIS — T380X5A Adverse effect of glucocorticoids and synthetic analogues, initial encounter: Secondary | ICD-10-CM | POA: Diagnosis not present

## 2020-02-11 DIAGNOSIS — N170 Acute kidney failure with tubular necrosis: Secondary | ICD-10-CM | POA: Diagnosis not present

## 2020-02-11 DIAGNOSIS — R230 Cyanosis: Secondary | ICD-10-CM | POA: Diagnosis present

## 2020-02-11 DIAGNOSIS — Z6841 Body Mass Index (BMI) 40.0 and over, adult: Secondary | ICD-10-CM

## 2020-02-11 DIAGNOSIS — J9601 Acute respiratory failure with hypoxia: Secondary | ICD-10-CM | POA: Diagnosis not present

## 2020-02-11 DIAGNOSIS — Z789 Other specified health status: Secondary | ICD-10-CM

## 2020-02-11 DIAGNOSIS — Z978 Presence of other specified devices: Secondary | ICD-10-CM

## 2020-02-11 DIAGNOSIS — Z7189 Other specified counseling: Secondary | ICD-10-CM | POA: Diagnosis not present

## 2020-02-11 DIAGNOSIS — R579 Shock, unspecified: Secondary | ICD-10-CM | POA: Diagnosis not present

## 2020-02-11 DIAGNOSIS — Z79899 Other long term (current) drug therapy: Secondary | ICD-10-CM

## 2020-02-11 DIAGNOSIS — A419 Sepsis, unspecified organism: Secondary | ICD-10-CM | POA: Diagnosis not present

## 2020-02-11 DIAGNOSIS — Z87442 Personal history of urinary calculi: Secondary | ICD-10-CM | POA: Diagnosis not present

## 2020-02-11 DIAGNOSIS — J80 Acute respiratory distress syndrome: Secondary | ICD-10-CM | POA: Diagnosis present

## 2020-02-11 DIAGNOSIS — R609 Edema, unspecified: Secondary | ICD-10-CM | POA: Diagnosis not present

## 2020-02-11 DIAGNOSIS — Z79890 Hormone replacement therapy (postmenopausal): Secondary | ICD-10-CM

## 2020-02-11 DIAGNOSIS — A4189 Other specified sepsis: Secondary | ICD-10-CM | POA: Diagnosis not present

## 2020-02-11 LAB — GLUCOSE, CAPILLARY: Glucose-Capillary: 199 mg/dL — ABNORMAL HIGH (ref 70–99)

## 2020-02-11 LAB — CBC WITH DIFFERENTIAL/PLATELET
Abs Immature Granulocytes: 0.03 10*3/uL (ref 0.00–0.07)
Basophils Absolute: 0 10*3/uL (ref 0.0–0.1)
Basophils Relative: 0 %
Eosinophils Absolute: 0 10*3/uL (ref 0.0–0.5)
Eosinophils Relative: 0 %
HCT: 47.1 % — ABNORMAL HIGH (ref 36.0–46.0)
Hemoglobin: 14.8 g/dL (ref 12.0–15.0)
Immature Granulocytes: 1 %
Lymphocytes Relative: 9 %
Lymphs Abs: 0.5 10*3/uL — ABNORMAL LOW (ref 0.7–4.0)
MCH: 29.2 pg (ref 26.0–34.0)
MCHC: 31.4 g/dL (ref 30.0–36.0)
MCV: 93.1 fL (ref 80.0–100.0)
Monocytes Absolute: 0.2 10*3/uL (ref 0.1–1.0)
Monocytes Relative: 4 %
Neutro Abs: 4.6 10*3/uL (ref 1.7–7.7)
Neutrophils Relative %: 86 %
Platelets: 198 10*3/uL (ref 150–400)
RBC: 5.06 MIL/uL (ref 3.87–5.11)
RDW: 14.6 % (ref 11.5–15.5)
WBC: 5.3 10*3/uL (ref 4.0–10.5)
nRBC: 0 % (ref 0.0–0.2)

## 2020-02-11 LAB — CBG MONITORING, ED: Glucose-Capillary: 189 mg/dL — ABNORMAL HIGH (ref 70–99)

## 2020-02-11 LAB — COMPREHENSIVE METABOLIC PANEL
ALT: 44 U/L (ref 0–44)
AST: 74 U/L — ABNORMAL HIGH (ref 15–41)
Albumin: 2.8 g/dL — ABNORMAL LOW (ref 3.5–5.0)
Alkaline Phosphatase: 72 U/L (ref 38–126)
Anion gap: 14 (ref 5–15)
BUN: 11 mg/dL (ref 6–20)
CO2: 24 mmol/L (ref 22–32)
Calcium: 6.3 mg/dL — CL (ref 8.9–10.3)
Chloride: 100 mmol/L (ref 98–111)
Creatinine, Ser: 0.83 mg/dL (ref 0.44–1.00)
GFR, Estimated: >60 mL/min (ref >60)
Glucose, Bld: 226 mg/dL — ABNORMAL HIGH (ref 70–99)
Potassium: 3.7 mmol/L (ref 3.5–5.1)
Sodium: 138 mmol/L (ref 135–145)
Total Bilirubin: 0.6 mg/dL (ref 0.3–1.2)
Total Protein: 6.8 g/dL (ref 6.5–8.1)

## 2020-02-11 LAB — CBC
Hemoglobin: 15.1 g/dL — ABNORMAL HIGH (ref 12.0–15.0)
MCH: 29.4 pg (ref 26.0–34.0)
MCV: 94.2 fL (ref 80.0–100.0)
RBC: 5.14 MIL/uL — ABNORMAL HIGH (ref 3.87–5.11)
RDW: 14.5 % (ref 11.5–15.5)
WBC: 4.7 10*3/uL (ref 4.0–10.5)
nRBC: 0 % (ref 0.0–0.2)

## 2020-02-11 LAB — FIBRINOGEN: Fibrinogen: >800 mg/dL — ABNORMAL HIGH (ref 210–475)

## 2020-02-11 LAB — D-DIMER, QUANTITATIVE: D-Dimer, Quant: 1.18 ug/mL-FEU — ABNORMAL HIGH (ref 0.00–0.50)

## 2020-02-11 LAB — HIV ANTIBODY (ROUTINE TESTING W REFLEX): HIV Screen 4th Generation wRfx: NONREACTIVE

## 2020-02-11 LAB — I-STAT BETA HCG BLOOD, ED (MC, WL, AP ONLY): I-stat hCG, quantitative: <5 m[IU]/mL (ref <5)

## 2020-02-11 LAB — FERRITIN: Ferritin: 295 ng/mL (ref 11–307)

## 2020-02-11 LAB — C-REACTIVE PROTEIN: CRP: 20.7 mg/dL — ABNORMAL HIGH (ref <1.0)

## 2020-02-11 LAB — RESPIRATORY PANEL BY RT PCR (FLU A&B, COVID)
Influenza A by PCR: NEGATIVE
Influenza B by PCR: NEGATIVE
SARS Coronavirus 2 by RT PCR: POSITIVE — AB

## 2020-02-11 LAB — LACTATE DEHYDROGENASE: LDH: 531 U/L — ABNORMAL HIGH (ref 98–192)

## 2020-02-11 LAB — LACTIC ACID, PLASMA
Lactic Acid, Venous: 3.1 mmol/L (ref 0.5–1.9)
Lactic Acid, Venous: 1.9 mmol/L (ref 0.5–1.9)

## 2020-02-11 LAB — PROCALCITONIN: Procalcitonin: 0.24 ng/mL

## 2020-02-11 LAB — TRIGLYCERIDES: Triglycerides: 87 mg/dL (ref <150)

## 2020-02-11 MED ORDER — GUAIFENESIN-DM 100-10 MG/5ML PO SYRP
10.0000 mL | ORAL_SOLUTION | ORAL | Status: DC | PRN
  Administered 2020-02-11 – 2020-02-22 (×9): 10 mL via ORAL
  Filled 2020-02-11 (×9): qty 10

## 2020-02-11 MED ORDER — TRAMADOL HCL 50 MG PO TABS
100.0000 mg | ORAL_TABLET | Freq: Four times a day (QID) | ORAL | Status: DC | PRN
  Administered 2020-02-11 – 2020-02-19 (×4): 100 mg via ORAL
  Filled 2020-02-11 (×4): qty 2

## 2020-02-11 MED ORDER — ACETAMINOPHEN 325 MG PO TABS: 650.0000 mg | ORAL_TABLET | Freq: Four times a day (QID) | ORAL | Status: DC | PRN

## 2020-02-11 MED ORDER — SODIUM CHLORIDE 0.9 % IV SOLN
200.0000 mg | Freq: Once | INTRAVENOUS | Status: AC
  Administered 2020-02-11: 200 mg via INTRAVENOUS
  Filled 2020-02-11: qty 40

## 2020-02-11 MED ORDER — ONDANSETRON HCL 4 MG PO TABS: 4.0000 mg | ORAL_TABLET | Freq: Four times a day (QID) | ORAL | Status: DC | PRN

## 2020-02-11 MED ORDER — ESCITALOPRAM OXALATE 10 MG PO TABS
10.0000 mg | ORAL_TABLET | Freq: Every day | ORAL | Status: DC
  Administered 2020-02-12 – 2020-02-23 (×12): 10 mg via ORAL
  Filled 2020-02-11 (×12): qty 1

## 2020-02-11 MED ORDER — ALBUTEROL SULFATE HFA 108 (90 BASE) MCG/ACT IN AERS
2.0000 | INHALATION_SPRAY | Freq: Four times a day (QID) | RESPIRATORY_TRACT | Status: DC
  Administered 2020-02-11 (×2): 2 via RESPIRATORY_TRACT
  Filled 2020-02-11 (×2): qty 6.7

## 2020-02-11 MED ORDER — FAMOTIDINE IN NACL 20-0.9 MG/50ML-% IV SOLN
20.0000 mg | Freq: Two times a day (BID) | INTRAVENOUS | Status: DC
  Administered 2020-02-11 – 2020-02-14 (×8): 20 mg via INTRAVENOUS
  Filled 2020-02-11 (×8): qty 50

## 2020-02-11 MED ORDER — INSULIN ASPART 100 UNIT/ML ~~LOC~~ SOLN
0.0000 [IU] | Freq: Every day | SUBCUTANEOUS | Status: DC
  Administered 2020-02-13: 2 [IU] via SUBCUTANEOUS

## 2020-02-11 MED ORDER — SODIUM CHLORIDE 0.9 % IV SOLN: 100.0000 mg | Freq: Every day | INTRAVENOUS | Status: DC

## 2020-02-11 MED ORDER — DEXAMETHASONE SODIUM PHOSPHATE 10 MG/ML IJ SOLN
6.0000 mg | INTRAMUSCULAR | Status: AC
  Administered 2020-02-11 – 2020-02-20 (×10): 6 mg via INTRAVENOUS
  Filled 2020-02-11 (×10): qty 1

## 2020-02-11 MED ORDER — LEVOTHYROXINE SODIUM 100 MCG PO TABS
100.0000 ug | ORAL_TABLET | Freq: Every day | ORAL | Status: DC
  Administered 2020-02-12 – 2020-02-26 (×14): 100 ug via ORAL
  Filled 2020-02-11 (×15): qty 1

## 2020-02-11 MED ORDER — ENOXAPARIN SODIUM 60 MG/0.6ML ~~LOC~~ SOLN
55.0000 mg | SUBCUTANEOUS | Status: DC
  Administered 2020-02-11 – 2020-02-12 (×2): 55 mg via SUBCUTANEOUS
  Filled 2020-02-11: qty 0.6
  Filled 2020-02-11: qty 0.55

## 2020-02-11 MED ORDER — ORAL CARE MOUTH RINSE
15.0000 mL | OROMUCOSAL | Status: DC
  Administered 2020-02-11: 15 mL via OROMUCOSAL

## 2020-02-11 MED ORDER — INSULIN ASPART 100 UNIT/ML ~~LOC~~ SOLN
0.0000 [IU] | Freq: Three times a day (TID) | SUBCUTANEOUS | Status: DC
  Administered 2020-02-11 – 2020-02-12 (×4): 4 [IU] via SUBCUTANEOUS
  Administered 2020-02-13: 1 [IU] via SUBCUTANEOUS
  Administered 2020-02-13 – 2020-02-14 (×5): 4 [IU] via SUBCUTANEOUS
  Administered 2020-02-15 (×2): 3 [IU] via SUBCUTANEOUS
  Administered 2020-02-15 – 2020-02-16 (×2): 4 [IU] via SUBCUTANEOUS
  Administered 2020-02-16: 3 [IU] via SUBCUTANEOUS
  Administered 2020-02-17: 4 [IU] via SUBCUTANEOUS
  Administered 2020-02-17 (×2): 3 [IU] via SUBCUTANEOUS
  Administered 2020-02-18: 4 [IU] via SUBCUTANEOUS
  Administered 2020-02-18 – 2020-02-19 (×2): 3 [IU] via SUBCUTANEOUS

## 2020-02-11 MED ORDER — BARICITINIB 2 MG PO TABS
4.0000 mg | ORAL_TABLET | Freq: Every day | ORAL | Status: AC
  Administered 2020-02-11 – 2020-02-24 (×14): 4 mg via ORAL
  Filled 2020-02-11 (×14): qty 2

## 2020-02-11 MED ORDER — CHLORHEXIDINE GLUCONATE 0.12% ORAL RINSE (MEDLINE KIT)
15.0000 mL | Freq: Two times a day (BID) | OROMUCOSAL | Status: DC
  Administered 2020-02-11: 15 mL via OROMUCOSAL

## 2020-02-11 MED ORDER — ONDANSETRON HCL 4 MG/2ML IJ SOLN: 4.0000 mg | Freq: Four times a day (QID) | INTRAMUSCULAR | Status: DC | PRN

## 2020-02-11 NOTE — Progress Notes (Signed)
eLink Physician-Brief Progress Note Patient Name: Brianna Velasquez DOB: 1970-06-11 MRN: [id number]   Date of Service  Feb 18, 2020  HPI/Events of Note  82F with obesity (BMI 49) who is admitted with COVID-19 pneumonia. Admitted to ICU due to oxygen needs.  Patient is currently self-proning and on [place] + NRB satting 85% (RT getting HFNC unit now).   eICU Interventions  # RESP: - COVID: Dexamethasone, remdesevir, baricitinib. HFNC + NRB for now. Self-proning. - High risk for progressing to need for intubation.  # Endocrine: - Insulin sliding scale.  # GI: - NPO except sips with meds.  # DVT PPX: Lovenox ppx # GI PPX: Not indicated at present # CODE STATUS: Full     Intervention Category Evaluation Type: New Patient Evaluation  Marveen Reeks [name] 2020-02-18, 11:09 PM

## 2020-02-11 NOTE — Plan of Care (Signed)
I went by and checked on patient at 2000, 10/17.  Patient was comfortable, self-proning and sats were 89-90% on HFNC +NRB.  Not tachypneic, and patient said that if it came to needing intubation, she would want that.  Rest of vitals showed normal HR and normal BP.  Lungs auscultation with bilateral diffuse crackles.    - Would recommend diuresis with lasix 20mg  IV when feasible, I realize that patient does not currently have a foley catheter and is currently self proning.  Could transition to bipap to use bedpan or use pure-wick catheter.   - Will likely need either to prone for sleeping, or will need BIPAP for sleep.  At high risk for intubation - continue remdesivir, baricitinib, and dexamethasone for severe COVID-19   , MD PCCM

## 2020-02-11 NOTE — ED Provider Notes (Addendum)
[facility] EMERGENCY DEPARTMENT Provider Note   CSN: [id number] Arrival date & time: 01/30/2020  1246     History Chief Complaint  Patient presents with  . Respiratory Distress    Brianna Velasquez is a 49 y.o. female.  The history is provided by the patient and the EMS personnel.  Shortness of Breath Severity:  Moderate Onset quality:  Gradual Timing:  Constant Progression:  Worsening Chronicity:  New Context: URI (COVID positive about 4 days ago but symtpoms for about a week)   Relieved by:  Nothing Worsened by:  Exertion Ineffective treatments:  Inhaler Associated symptoms: cough and fever   Associated symptoms: no abdominal pain, no chest pain, no ear pain, no rash, no sore throat and no vomiting        No past medical history on file.  Patient Active Problem List   Diagnosis Date Noted  . ARDS (adult respiratory distress syndrome) ([place]) 02/20/2020  . Anemia 09/04/2012  . Carpal tunnel syndrome 09/04/2012  . Closed fracture of phalanx of foot 09/04/2012  . Closed fracture of surgical neck of humerus 09/04/2012  . Dysmenorrhea 09/04/2012  . Morbid obesity ([place]) 09/04/2012  . Ureteric stone 09/04/2012    No past surgical history on file.   OB History   No obstetric history on file.     No family history on file.  Social History   Tobacco Use  . Smoking status: Not on file  Substance Use Topics  . Alcohol use: Not on file  . Drug use: Not on file    Home Medications Prior to Admission medications   Medication Sig Start Date End Date Taking? Authorizing Provider  ASPIRIN 81 PO Take by mouth.    [provider]  atorvastatin (LIPITOR) 20 MG tablet Take 20 mg by mouth daily.    [provider]  cyclobenzaprine (FLEXERIL) 10 MG tablet Take 10 mg by mouth 2 (two) times daily. 02/01/20   [provider]  escitalopram (LEXAPRO) 10 MG tablet Take 10 mg by mouth daily.    [provider]  ezetimibe  (ZETIA) 10 MG tablet Take 10 mg by mouth daily.    [provider]  hydrOXYzine (ATARAX/VISTARIL) 25 MG tablet Take 25 mg by mouth daily as needed. 10/09/19   [provider]  ibuprofen (ADVIL) 800 MG tablet Take 800 mg by mouth every 8 (eight) hours as needed.    [provider]  levothyroxine (SYNTHROID) 100 MCG tablet Take 100 mcg by mouth daily. 01/09/20   [provider]  levothyroxine (SYNTHROID) 88 MCG tablet Take 88 mcg by mouth daily before breakfast.    [provider]  traMADol (ULTRAM) 50 MG tablet Take by mouth every 6 (six) hours as needed.    [provider]    Allergies    Patient has no known allergies.  Review of Systems   Review of Systems  Constitutional: Positive for fatigue and fever. Negative for chills.  HENT: Negative for ear pain and sore throat.   Eyes: Negative for pain and visual disturbance.  Respiratory: Positive for cough and shortness of breath.   Cardiovascular: Negative for chest pain and palpitations.  Gastrointestinal: Negative for abdominal pain and vomiting.  Genitourinary: Negative for dysuria and hematuria.  Musculoskeletal: Negative for arthralgias and back pain.  Skin: Negative for color change and rash.  Neurological: Negative for seizures and syncope.  All other systems reviewed and are negative.   Physical Exam Updated Vital Signs BP Marland Kitchen)  124/92   Pulse 92   Temp 99.7 F (37.6 C) (Axillary)   Resp (!) 29   Ht 4' 11"  (1.499 m)   Wt 111.1 kg   SpO2 (!) 88%   BMI 49.48 kg/m   Physical Exam Vitals and nursing note reviewed.  Constitutional:      General: She is not in acute distress.    Appearance: She is well-developed. She is obese. She is not ill-appearing.     Comments: Patient is morbidly obese but overall well-appearing  HENT:     Head: Normocephalic and atraumatic.     Mouth/Throat:     Mouth: Mucous membranes are moist.  Eyes:     Extraocular Movements: Extraocular  movements intact.     Conjunctiva/sclera: Conjunctivae normal.     Pupils: Pupils are equal, round, and reactive to light.  Cardiovascular:     Rate and Rhythm: Regular rhythm. Tachycardia present.     Pulses: Normal pulses.     Heart sounds: No murmur heard.   Pulmonary:     Effort: No respiratory distress.     Comments: Coarse breath sounds throughout, mildly increased work of breathing but patient is conversational and speaks in clear sentences Abdominal:     Palpations: Abdomen is soft.     Tenderness: There is no abdominal tenderness.  Musculoskeletal:     Cervical back: Neck supple.  Skin:    General: Skin is warm and dry.     Capillary Refill: Capillary refill takes less than 2 seconds.  Neurological:     General: No focal deficit present.     Mental Status: She is alert.  Psychiatric:        Mood and Affect: Mood normal.     ED Results / Procedures / Treatments   Labs (all labs ordered are listed, but only abnormal results are displayed) Labs Reviewed  LACTIC ACID, PLASMA - Abnormal; Notable for the following components:      Result Value   Lactic Acid, Venous 3.1 (*)    All other components within normal limits  CBC WITH DIFFERENTIAL/PLATELET - Abnormal; Notable for the following components:   HCT 47.1 (*)    Lymphs Abs 0.5 (*)    All other components within normal limits  COMPREHENSIVE METABOLIC PANEL - Abnormal; Notable for the following components:   Glucose, Bld 226 (*)    Calcium 6.3 (*)    Albumin 2.8 (*)    AST 74 (*)    All other components within normal limits  D-DIMER, QUANTITATIVE (NOT AT [facility]) - Abnormal; Notable for the following components:   D-Dimer, Quant 1.18 (*)    All other components within normal limits  LACTATE DEHYDROGENASE - Abnormal; Notable for the following components:   LDH 531 (*)    All other components within normal limits  FIBRINOGEN - Abnormal; Notable for the following components:   Fibrinogen >800 (*)    All other  components within normal limits  C-REACTIVE PROTEIN - Abnormal; Notable for the following components:   CRP 20.7 (*)    All other components within normal limits  RESPIRATORY PANEL BY RT PCR (FLU A&B, COVID)  CULTURE, BLOOD (ROUTINE X 2)  CULTURE, BLOOD (ROUTINE X 2)  PROCALCITONIN  TRIGLYCERIDES  FERRITIN  HIV ANTIBODY (ROUTINE TESTING W REFLEX)  LACTIC ACID, PLASMA  CBC  I-STAT BETA HCG BLOOD, ED ([facility], [facility], [facility] ONLY)    EKG EKG Interpretation  Date/Time:  Sunday February 11 2020 12:52:55 EDT Ventricular Rate:  113  PR Interval:    QRS Duration: 95 QT Interval:  362 QTC Calculation: 497 R Axis:   5 Text Interpretation: Sinus tachycardia Low voltage, precordial leads Borderline ST depression, anterior leads Borderline prolonged QT interval Baseline wander in lead(s) III V3 V4 V5 V6 Confirmed by Lennice Sites [phone]) on 01/26/2020 12:57:42 PM   Radiology DG Chest Port 1 View  Result Date: 02/19/2020 CLINICAL DATA:  Respiratory distress.  COVID-19 positive EXAM: PORTABLE CHEST 1 VIEW COMPARISON:  July 08, 2018 FINDINGS: There is hazy opacity throughout the lungs at multiple sites, most notably in the mid and lower lung regions bilaterally as well as in the upper lobe regions, somewhat more on the left than on the right. Heart is enlarged with pulmonary vascularity normal. No adenopathy. No bone lesions. There are surgical clips in the thyroid region. IMPRESSION: Extensive multifocal airspace opacity, likely multifocal atypical organism pneumonia. Areas of relative consolidation noted in the lower lung regions and in the right mid lung; there may be a degree of superimposed bacterial superinfection. Stable cardiac prominence.  No adenopathy evident. Electronically Signed   By: Lowella Grip III M.D.   On: 02/06/2020 14:06    Procedures .Critical Care Performed by: Lennice Sites, DO Authorized by: Lennice Sites, DO   Critical care provider statement:    Critical care time  (minutes):  50   Critical care was necessary to treat or prevent imminent or life-threatening deterioration of the following conditions:  Respiratory failure   Critical care was time spent personally by me on the following activities:  Blood draw for specimens, development of treatment plan with patient or surrogate, discussions with primary provider, evaluation of patient's response to treatment, examination of patient, obtaining history from patient or surrogate, ordering and performing treatments and interventions, ordering and review of laboratory studies, ordering and review of radiographic studies, pulse oximetry, re-evaluation of patient's condition and review of old charts   I assumed direction of critical care for this patient from another provider in my specialty: no     (including critical care time)  Medications Ordered in ED Medications  remdesivir 200 mg in sodium chloride 0.9% 250 mL IVPB (has no administration in time range)  dexamethasone (DECADRON) injection 6 mg (6 mg Intravenous Given 02/12/2020 1315)  albuterol (VENTOLIN HFA) 108 (90 Base) MCG/ACT inhaler 2 puff (2 puffs Inhalation Given 02/10/2020 1448)  enoxaparin (LOVENOX) injection 55 mg (has no administration in time range)  chlorhexidine gluconate (MEDLINE KIT) (PERIDEX) 0.12 % solution 15 mL (has no administration in time range)  MEDLINE mouth rinse (15 mLs Mouth Rinse Not Given 01/27/2020 1505)  famotidine (PEPCID) IVPB 20 mg premix (has no administration in time range)  baricitinib (OLUMIANT) tablet 4 mg (has no administration in time range)  guaiFENesin-dextromethorphan (ROBITUSSIN DM) 100-10 MG/5ML syrup 10 mL (has no administration in time range)  insulin aspart (novoLOG) injection 0-20 Units (has no administration in time range)  insulin aspart (novoLOG) injection 0-5 Units (has no administration in time range)  acetaminophen (TYLENOL) tablet 650 mg (has no administration in time range)  traMADol (ULTRAM) tablet 100  mg (has no administration in time range)  ondansetron (ZOFRAN) tablet 4 mg (has no administration in time range)    Or  ondansetron (ZOFRAN) injection 4 mg (has no administration in time range)  escitalopram (LEXAPRO) tablet 10 mg (has no administration in time range)  levothyroxine (SYNTHROID) tablet 100 mcg (has no administration in time range)    ED Course  I have reviewed the  triage vital signs and the nursing notes.  Pertinent labs & imaging results that were available during my care of the patient were reviewed by me and considered in my medical decision making (see chart for details).    MDM Rules/Calculators/A&P                          Brianna Velasquez is a 49 year old female with history of morbid obesity presents to the ED with shortness of breath in the setting of Covid positive. Patient hypoxic to 70% on room air. Had been on BiPAP with EMS and had oxygenation in the upper 80s after being found on room air in the 40s by EMS. Covid +4 days ago but has had Covid symptoms for about a week. Overall she is in no major respiratory distress but she is morbidly obese and has some increased work of breathing. Difficult to hear breath sounds given obesity. Patient was placed on a nonrebreather and high flow nasal cannula to the max and oxygen at is now in the upper 80s. Blood pressure overall is unremarkable. Covid screening labs and chest x-ray have been ordered. Will give patient IV remdesivir and IV Decadron. I have touch base with ICU to come evaluate the patient as I believe she will benefit from ICU given the amount of oxygen she is requiring as she is high risk to need intubation.  Chest x-ray shows multifocal pneumonia likely in the setting of Covid pneumonia.  Inflammatory markers elevated including lactic acid, fibrinogen.  No significant anemia, electrolyte abnormality, kidney injury.  Glucose is mildly elevated as well.  Patient requiring high amounts of oxygen nonrebreather and  high flow nasal cannula both maxed out and maintaining oxygen saturations in the upper 80s.  Admitted to ICU for further Covid pneumonia care.  Has been hemodynamically stable.  This chart was dictated using voice recognition software.  Despite best efforts to proofread,  errors can occur which can change the documentation meaning.    Final Clinical Impression(s) / ED Diagnoses Final diagnoses:  ARDS (adult respiratory distress syndrome) ([place])  COVID-19  Acute respiratory failure with hypoxia [facility])    Rx / DC Orders ED Discharge Orders    None       Lennice Sites, DO 01/28/2020 [place], DO 02/04/2020 1526

## 2020-02-11 NOTE — ED Notes (Signed)
Admitting MD aware of lactic and calcium levels.

## 2020-02-11 NOTE — ED Triage Notes (Signed)
Pt bib ems from home where pt had oxygen saturation of 40% and was cyanotic per ems. Pt dx with covid last week. Placed on cpap en route with improvement. On arrival to the ED pt placed on [place] with oxygen saturaitons of 81%.  137/91 74% CPAP 268 CBG  HR 113

## 2020-02-11 NOTE — H&P (Signed)
NAME:  Brianna Velasquez, MRN:  [id number], DOB:  [birth year], LOS: 0 ADMISSION DATE:  2020/02/14, CONSULTATION DATE: 02-14-20 REFERRING MD: Dr. Lockie Mola, CHIEF COMPLAINT: Hypoxemic respiratory failure  Brief History   49 year old obese woman, diagnosed with COVID-19 4 days PTA with approximately 1 week of symptoms at presentation.  Found to have hypoxemic respiratory failure, initially on BiPAP, subsequently comfortable but marginal on 30 L/min HF [place] +1.00 mask.   History of present illness   49 year old nonvaccinated morbidly obese woman with a history of tobacco use, hyperlipidemia, depression, hypothyroidism.  She began to experience upper respiratory symptoms approximately 1 week ago - cough with some sputum production, HA, change in taste, mild diarrhea. Multiple other family members ill as well, tested positive.  She underwent COVID-19 testing 4 days ago, positive by her report.  She has developed progressive dyspnea and was found to be cyanotic 10/17.  EMS activated an SPO2 70%, improved to upper 80s on BiPAP in transit.  In the ED transitioned back to 15 L/min high flow nasal cannula +1.00 mask, SPO2 in the high 80s percent, respiratory pattern comfortable.  Dexamethasone initiated, single dose remdesivir given in the ED.  Chest x-ray shows diffuse bilateral base predominant pulmonary infiltrates in an ARDS pattern.  Past Medical History  Renal calculi Hypothyroidism Depression MRSA infection following cesarean section  Significant Hospital Events   Brought to ED on BiPAP, hypoxemic 10/17, admitted ICU level care  Consults:    Procedures:    Significant Diagnostic Tests:    Micro Data:  SARSCoV2 10/17 >>   Antimicrobials:  Remdesivir x1 10/17  Baricitinib 10/17 Dexamethasone 10/17  Interim history/subjective:  Feels a bit less dyspneic, comfortable respiratory pattern.  Headache is improved SPO2 ranging 84-89% on 30 L/min +1.00 mask She states that she believes she  can prone position of necessary and also that she could retry BiPAP if necessary  Objective   Blood pressure 114/81, pulse 91, temperature 99.7 F (37.6 C), temperature source Axillary, resp. rate (!) 26, height 4\' 11"  (1.499 m), weight 111.1 kg, SpO2 (!) 89 %.       No intake or output data in the 24 hours ending 2020-02-14 1454 Filed Weights   02-14-20 1254  Weight: 111.1 kg    Examination: General: Morbidly obese woman, laying in bed at 45 degrees. HENT: Oropharynx clear, M4 airway, no upper or secretions, strong voice Lungs: Bilateral inspiratory crackles, no wheezes Cardiovascular: Regular, distant, tachycardic 100s Abdomen: Obese, soft, positive bowel sounds, some mild erythema pannus Extremities: No edema Neuro: Awake, alert, appropriate, answers all questions, follows commands, good strength throughout GU: Not examined  Resolved Hospital Problem list     Assessment & Plan:  Acute hypoxemic respiratory failure due to COVID-19 pneumonia and ARDS -Marginal oxygenation although she is stabilizing and has an improvement in work of breathing on 15 L/min +1.00 mask.  Plenty of room to uptitrate her high flow nasal cannula if needed.  Given her pulmonary infiltrates and her obesity I suspect that she is going to require BiPAP, ICU level care, to manage WOB, probable component OSA / OHS.  Explained to her that she is at risk for intubation and mechanical ventilation.  She understands and would want all aggressive care if indicated. -Remdesivir given x1 dose in the ED -Continue dexamethasone -Consult pharmacy to initiate baricitinib -I-S, pulmonary hygiene, self proning discussed with the patient  Hyperglycemia -SSI protocol ordered  Hypothyroidism -home synthroid  Hyperlipidemia  -hold home zetia and lipitor   Best practice:  Diet: NPO initially given concern that she will require BiPAP / MV Pain/Anxiety/Delirium protocol (if indicated): NA VAP protocol (if indicated):  NA DVT prophylaxis: enoxaparin  GI prophylaxis: pepcid Glucose control: SSI  Mobility: BR Code Status: Full  Family Communication: called mother [name] 10/17 Disposition: ICU  Labs   CBC: Recent Labs  Lab 02/17/2020 1315  WBC 5.3  NEUTROABS 4.6  HGB 14.8  HCT 47.1*  MCV 93.1  PLT 198    Basic Metabolic Panel: Recent Labs  Lab 02/15/2020 1315  NA 138  K 3.7  CL 100  CO2 24  GLUCOSE 226*  BUN 11  CREATININE 0.83  CALCIUM 6.3*   GFR: Estimated Creatinine Clearance: 89.1 mL/min (by C-G formula based on SCr of 0.83 mg/dL). Recent Labs  Lab 02/01/2020 1315  WBC 5.3  LATICACIDVEN 3.1*    Liver Function Tests: Recent Labs  Lab 01/29/2020 1315  AST 74*  ALT 44  ALKPHOS 72  BILITOT 0.6  PROT 6.8  ALBUMIN 2.8*   No results for input(s): LIPASE, AMYLASE in the last 168 hours. No results for input(s): AMMONIA in the last 168 hours.  ABG No results found for: PHART, PCO2ART, PO2ART, HCO3, TCO2, ACIDBASEDEF, O2SAT   Coagulation Profile: No results for input(s): INR, PROTIME in the last 168 hours.  Cardiac Enzymes: No results for input(s): CKTOTAL, CKMB, CKMBINDEX, TROPONINI in the last 168 hours.  HbA1C: No results found for: HGBA1C  CBG: No results for input(s): GLUCAP in the last 168 hours.  Review of Systems:   As per HPI  Past Medical History  She,  has no past medical history on file.   Surgical History   No past surgical history on file.   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   Allergies No Known Allergies   Home Medications  Prior to Admission medications   Medication Sig Start Date End Date Taking? Authorizing Provider  ASPIRIN 81 PO Take by mouth.    [provider]  atorvastatin (LIPITOR) 20 MG tablet Take 20 mg by mouth daily.    [provider]  escitalopram (LEXAPRO) 10 MG  tablet Take 10 mg by mouth daily.    [provider]  ezetimibe (ZETIA) 10 MG tablet Take 10 mg by mouth daily.    [provider]  hydrOXYzine (ATARAX/VISTARIL) 25 MG tablet Take 25 mg by mouth daily as needed. 10/09/19   [provider]  ibuprofen (ADVIL) 800 MG tablet Take 800 mg by mouth every 8 (eight) hours as needed.    [provider]  levothyroxine (SYNTHROID) 88 MCG tablet Take 88 mcg by mouth daily before breakfast.    [provider]  traMADol (ULTRAM) 50 MG tablet Take by mouth every 6 (six) hours as needed.    [provider]     Critical care time: 60 min     Levy Pupa, MD, PhD 01/31/2020, 2:55 PM [facility] [phone] or if no answer [phone]

## 2020-02-11 NOTE — Progress Notes (Signed)
RT at bedside to assess pt. RT found pt self proning, tolerating well.

## 2020-02-12 ENCOUNTER — Inpatient Hospital Stay (HOSPITAL_COMMUNITY): Payer: Medicaid Other

## 2020-02-12 ENCOUNTER — Other Ambulatory Visit: Payer: Self-pay

## 2020-02-12 DIAGNOSIS — U071 COVID-19: Secondary | ICD-10-CM | POA: Diagnosis not present

## 2020-02-12 DIAGNOSIS — J80 Acute respiratory distress syndrome: Secondary | ICD-10-CM | POA: Diagnosis not present

## 2020-02-12 LAB — GLUCOSE, CAPILLARY
Glucose-Capillary: 165 mg/dL — ABNORMAL HIGH (ref 70–99)
Glucose-Capillary: 162 mg/dL — ABNORMAL HIGH (ref 70–99)
Glucose-Capillary: 151 mg/dL — ABNORMAL HIGH (ref 70–99)
Glucose-Capillary: 161 mg/dL — ABNORMAL HIGH (ref 70–99)
Glucose-Capillary: 198 mg/dL — ABNORMAL HIGH (ref 70–99)

## 2020-02-12 LAB — COMPREHENSIVE METABOLIC PANEL
ALT: 47 U/L — ABNORMAL HIGH (ref 0–44)
AST: 83 U/L — ABNORMAL HIGH (ref 15–41)
Albumin: 2.5 g/dL — ABNORMAL LOW (ref 3.5–5.0)
Alkaline Phosphatase: 66 U/L (ref 38–126)
Anion gap: 12 (ref 5–15)
BUN: 21 mg/dL — ABNORMAL HIGH (ref 6–20)
CO2: 29 mmol/L (ref 22–32)
Calcium: 6.3 mg/dL — CL (ref 8.9–10.3)
Chloride: 102 mmol/L (ref 98–111)
Creatinine, Ser: 0.9 mg/dL (ref 0.44–1.00)
GFR, Estimated: >60 mL/min (ref >60)
Glucose, Bld: 182 mg/dL — ABNORMAL HIGH (ref 70–99)
Potassium: 4.4 mmol/L (ref 3.5–5.1)
Sodium: 143 mmol/L (ref 135–145)
Total Bilirubin: 0.6 mg/dL (ref 0.3–1.2)
Total Protein: 6.5 g/dL (ref 6.5–8.1)

## 2020-02-12 LAB — FERRITIN: Ferritin: 449 ng/mL — ABNORMAL HIGH (ref 11–307)

## 2020-02-12 LAB — LACTATE DEHYDROGENASE: LDH: 653 U/L — ABNORMAL HIGH (ref 98–192)

## 2020-02-12 LAB — D-DIMER, QUANTITATIVE: D-Dimer, Quant: 1.54 ug/mL-FEU — ABNORMAL HIGH (ref 0.00–0.50)

## 2020-02-12 LAB — MRSA PCR SCREENING: MRSA by PCR: NEGATIVE

## 2020-02-12 LAB — C-REACTIVE PROTEIN: CRP: 20.9 mg/dL — ABNORMAL HIGH (ref <1.0)

## 2020-02-12 MED ORDER — ALBUTEROL SULFATE HFA 108 (90 BASE) MCG/ACT IN AERS
2.0000 | INHALATION_SPRAY | Freq: Four times a day (QID) | RESPIRATORY_TRACT | Status: DC | PRN
  Administered 2020-02-15: 2 via RESPIRATORY_TRACT
  Filled 2020-02-12: qty 6.7

## 2020-02-12 MED ORDER — CHLORHEXIDINE GLUCONATE CLOTH 2 % EX PADS
6.0000 | MEDICATED_PAD | Freq: Every day | CUTANEOUS | Status: DC
  Administered 2020-02-12: 6 via TOPICAL

## 2020-02-12 MED ORDER — CLONAZEPAM 0.125 MG PO TBDP
0.1250 mg | ORAL_TABLET | Freq: Two times a day (BID) | ORAL | Status: DC | PRN
  Administered 2020-02-12: 0.125 mg via ORAL
  Filled 2020-02-12: qty 1

## 2020-02-12 MED ORDER — CALCIUM GLUCONATE-NACL 1-0.675 GM/50ML-% IV SOLN
1.0000 g | Freq: Once | INTRAVENOUS | Status: AC
  Administered 2020-02-12: 1000 mg via INTRAVENOUS
  Filled 2020-02-12: qty 50

## 2020-02-12 MED ORDER — ORAL CARE MOUTH RINSE
15.0000 mL | Freq: Two times a day (BID) | OROMUCOSAL | Status: DC
  Administered 2020-02-12 – 2020-02-23 (×22): 15 mL via OROMUCOSAL

## 2020-02-12 NOTE — Progress Notes (Signed)
  Mother, [name], [phone] updated by phone  RN also reports patient reporting anxiety and frequently watches pulse ox.  Will trial small dose of klonopin 0.125mg  ODT, adjust as needed, avoiding oversedation.    Posey Boyer, ACNP [facility] 02/12/2020, 2:05 PM

## 2020-02-12 NOTE — Progress Notes (Signed)
NAME:  Brianna Velasquez, MRN:  [id number], DOB:  1971/03/17, LOS: 1 ADMISSION DATE:  03/03/2020, CONSULTATION DATE: February 26, 2020 REFERRING MD: Dr. Lockie Mola, CHIEF COMPLAINT: Hypoxemic respiratory failure  Brief History   49 year old obese woman, diagnosed with COVID-19 4 days PTA with approximately 1 week of symptoms at presentation.  Found to have hypoxemic respiratory failure, initially on BiPAP, subsequently comfortable but marginal on 30 L/min HF [place] +1.00 mask.   History of present illness   49 year old nonvaccinated morbidly obese woman with a history of tobacco use, hyperlipidemia, depression, hypothyroidism.  She began to experience upper respiratory symptoms approximately 1 week ago - cough with some sputum production, HA, change in taste, mild diarrhea. Multiple other family members ill as well, tested positive.  She underwent COVID-19 testing 4 days ago, positive by her report.  She has developed progressive dyspnea and was found to be cyanotic 10/17.  EMS activated an SPO2 70%, improved to upper 80s on BiPAP in transit.  In the ED transitioned back to 15 L/min high flow nasal cannula +1.00 mask, SPO2 in the high 80s percent, respiratory pattern comfortable.  Dexamethasone initiated, single dose remdesivir given in the ED.  Chest x-ray shows diffuse bilateral base predominant pulmonary infiltrates in an ARDS pattern.  Past Medical History  Renal calculi Hypothyroidism Depression MRSA infection following cesarean section  Significant Hospital Events   Brought to ED on BiPAP, hypoxemic 10/17, admitted ICU level care  Consults:    Procedures:   Significant Diagnostic Tests:   Micro Data:  SARSCoV2 10/17 >> positive 10/17 MRSA PCR >> neg 10/17 BCx 2 >>  Antimicrobials:  Remdesivir x1 10/17  Baricitinib 10/17 >> Dexamethasone 10/17 >>   Interim history/subjective:   Feels breathing is better today, still complains of headache Remains on HHFNC 50L /1.0 and NRB; BiPAP not  needed overnight Doing well self proning  Afebrile   Objective   Blood pressure 131/77, pulse 87, temperature 98.5 F (36.9 C), temperature source Axillary, resp. rate (!) 26, height 4' 11.02" (1.499 m), weight 112.8 kg, SpO2 (!) 81 %.    FiO2 (%):  [100 %] 100 %   Intake/Output Summary (Last 24 hours) at 02/12/2020 1115 Last data filed at 02/12/2020 0700 Gross per 24 hour  Intake 240 ml  Output 300 ml  Net -60 ml   Filed Weights   February 26, 2020 1254 02/28/2020 2319  Weight: 111.1 kg 112.8 kg   Examination: General: Morbidly obese female semi prone in bed in NAD HEENT: MM pink/moist Neuro: AOx 4, MAE CV: RR, NSR, distant heart sounds PULM:  Mildly tachypneic but not labored, speaking in sentences, diminished, bibasilar rales R >L, slight wheeze GI: soft, +bs Extremities: warm/dry, no LE edema  Skin: no rashes  10/18 CXR reviewed-> progression of bilateral infiltrates   Resolved Hospital Problem list     Assessment & Plan:  Acute hypoxemic respiratory failure due to COVID-19 pneumonia and ARDS Currently in no distress and happy hypoxic with acceptable saturations on HHFNC and NRB Remains high risk for intubation, continue in ICU  PRN BiPAP as needed/ probable component of OSA/ OHS   Maintain O2 saturations for goal SpO2 > 85% Monitor closely for signs of ventilatory failure such as nasal flaring, accessory muscle use, abdominal paradoxical movements.   Self proning as tolerated Trend inflammatory markers Ongoing aggressive pulmonary hygiene, progress activity as tolerated  Continue decadron 6mg  daily for 10 days  Remdesivir deferred; baricitinib per pharmacy  Albuterol HFA Intermittent CXR/ ABG prn   Hyperglycemia, exacerbated  with steroids  SSI resistant  May need to add Levemir   Hypothyroidism Continue home synthroid  Hyperlipidemia  Holding home zetia and lipitor  Hypocalcemia  Corrected Calcium 7.5 Calcium gluconate 1 gm x 1  Best practice:  Diet: start  soft diet  Pain/Anxiety/Delirium protocol (if indicated): NA VAP protocol (if indicated): NA DVT prophylaxis: enoxaparin  GI prophylaxis: pepcid Glucose control: SSI  Mobility: progress as tolerated  Code Status: Full  Family Communication: called mother [name] 10/17; pending 10/18.  Disposition: ICU  Labs   CBC: Recent Labs  Lab 02-17-2020 1315 17-Feb-2020 1752  WBC 5.3 4.7  NEUTROABS 4.6  --   HGB 14.8 15.1*  HCT 47.1* 48.4*  MCV 93.1 94.2  PLT 198 195    Basic Metabolic Panel: Recent Labs  Lab 02/17/20 1315 02/12/20 0623  NA 138 143  K 3.7 4.4  CL 100 102  CO2 24 29  GLUCOSE 226* 182*  BUN 11 21*  CREATININE 0.83 0.90  CALCIUM 6.3* 6.3*   GFR: Estimated Creatinine Clearance: 82.9 mL/min (by C-G formula based on SCr of 0.9 mg/dL). Recent Labs  Lab 02/17/2020 1315 02/17/20 1752  PROCALCITON 0.24  --   WBC 5.3 4.7  LATICACIDVEN 3.1* 1.9    Liver Function Tests: Recent Labs  Lab 02-17-2020 1315 02/12/20 0623  AST 74* 83*  ALT 44 47*  ALKPHOS 72 66  BILITOT 0.6 0.6  PROT 6.8 6.5  ALBUMIN 2.8* 2.5*   No results for input(s): LIPASE, AMYLASE in the last 168 hours. No results for input(s): AMMONIA in the last 168 hours.  ABG No results found for: PHART, PCO2ART, PO2ART, HCO3, TCO2, ACIDBASEDEF, O2SAT   Coagulation Profile: No results for input(s): INR, PROTIME in the last 168 hours.  Cardiac Enzymes: No results for input(s): CKTOTAL, CKMB, CKMBINDEX, TROPONINI in the last 168 hours.  HbA1C: No results found for: HGBA1C  CBG: Recent Labs  Lab 17-Feb-2020 1724 17-Feb-2020 2302 02/12/20 0402 02/12/20 0739  GLUCAP 189* 199* 165* 162*    Critical care time: 35 min     Posey Boyer, ACNP [facility] 02/12/2020, 11:15 AM  See Loretha Stapler for personal pager PCCM on call pager [phone]

## 2020-02-12 NOTE — Progress Notes (Signed)
Patient is prone at this time. Patient is hypoxic but does not appear to be in respiratory distress. BIPAP is not needed at this time. RT will monitor as needed.

## 2020-02-12 NOTE — Plan of Care (Signed)
  Problem: Education: °Goal: Knowledge of risk factors and measures for prevention of condition will improve °02/12/2020 0507 by Soledad Budreau, RN °Outcome: Progressing °02/12/2020 0445 by Adalina Dopson, RN °Outcome: Progressing °  °Problem: Coping: °Goal: Psychosocial and spiritual needs will be supported °02/12/2020 0507 by Kingsly Kloepfer, RN °Outcome: Progressing °02/12/2020 0445 by Daniele Yankowski, RN °Outcome: Progressing °  °Problem: Respiratory: °Goal: Will maintain a patent airway °02/12/2020 0507 by Dameshia Seybold, RN °Outcome: Progressing °02/12/2020 0445 by Sofiah Lyne, RN °Outcome: Progressing °Goal: Complications related to the disease process, condition or treatment will be avoided or minimized °02/12/2020 0507 by Cruz Devilla, RN °Outcome: Progressing °02/12/2020 0445 by Kishawn Pickar, RN °Outcome: Progressing °  °

## 2020-02-12 NOTE — Plan of Care (Signed)
°  Problem: Education: Goal: Knowledge of risk factors and measures for prevention of condition will improve 02/12/2020 0507 by Lenore Manner, RN Outcome: Progressing 02/12/2020 0445 by Lenore Manner, RN Outcome: Progressing   Problem: Coping: Goal: Psychosocial and spiritual needs will be supported 02/12/2020 0507 by Lenore Manner, RN Outcome: Progressing 02/12/2020 0445 by Lenore Manner, RN Outcome: Progressing   Problem: Respiratory: Goal: Will maintain a patent airway 02/12/2020 0507 by Lenore Manner, RN Outcome: Progressing 02/12/2020 0445 by Lenore Manner, RN Outcome: Progressing Goal: Complications related to the disease process, condition or treatment will be avoided or minimized 02/12/2020 0507 by Lenore Manner, RN Outcome: Progressing 02/12/2020 0445 by Lenore Manner, RN Outcome: Progressing

## 2020-02-13 ENCOUNTER — Inpatient Hospital Stay (HOSPITAL_COMMUNITY): Payer: Medicaid Other

## 2020-02-13 DIAGNOSIS — J80 Acute respiratory distress syndrome: Secondary | ICD-10-CM | POA: Diagnosis not present

## 2020-02-13 DIAGNOSIS — R0902 Hypoxemia: Secondary | ICD-10-CM

## 2020-02-13 DIAGNOSIS — R0602 Shortness of breath: Secondary | ICD-10-CM

## 2020-02-13 DIAGNOSIS — U071 COVID-19: Secondary | ICD-10-CM | POA: Diagnosis not present

## 2020-02-13 DIAGNOSIS — J9601 Acute respiratory failure with hypoxia: Secondary | ICD-10-CM

## 2020-02-13 DIAGNOSIS — J1282 Pneumonia due to coronavirus disease 2019: Secondary | ICD-10-CM

## 2020-02-13 LAB — LACTATE DEHYDROGENASE: LDH: 1046 U/L — ABNORMAL HIGH (ref 98–192)

## 2020-02-13 LAB — CBC
HCT: 46.7 % — ABNORMAL HIGH (ref 36.0–46.0)
Hemoglobin: 14.5 g/dL (ref 12.0–15.0)
MCH: 29.2 pg (ref 26.0–34.0)
MCHC: 31 g/dL (ref 30.0–36.0)
MCV: 94.2 fL (ref 80.0–100.0)
Platelets: 241 10*3/uL (ref 150–400)
RBC: 4.96 MIL/uL (ref 3.87–5.11)
RDW: 14.7 % (ref 11.5–15.5)
WBC: 6.1 10*3/uL (ref 4.0–10.5)
nRBC: 0 % (ref 0.0–0.2)

## 2020-02-13 LAB — POCT I-STAT 7, (LYTES, BLD GAS, ICA,H+H)
Acid-Base Excess: 5 mmol/L — ABNORMAL HIGH (ref 0.0–2.0)
Acid-Base Excess: 8 mmol/L — ABNORMAL HIGH (ref 0.0–2.0)
Bicarbonate: 31.2 mmol/L — ABNORMAL HIGH (ref 20.0–28.0)
Bicarbonate: 33.4 mmol/L — ABNORMAL HIGH (ref 20.0–28.0)
Calcium, Ion: 0.86 mmol/L — CL (ref 1.15–1.40)
Calcium, Ion: 0.88 mmol/L — CL (ref 1.15–1.40)
HCT: 41 % (ref 36.0–46.0)
HCT: 41 % (ref 36.0–46.0)
Hemoglobin: 13.9 g/dL (ref 12.0–15.0)
Hemoglobin: 13.9 g/dL (ref 12.0–15.0)
O2 Saturation: 78 %
O2 Saturation: 94 %
Patient temperature: 98.6
Potassium: 3.8 mmol/L (ref 3.5–5.1)
Potassium: 3.9 mmol/L (ref 3.5–5.1)
Sodium: 140 mmol/L (ref 135–145)
Sodium: 140 mmol/L (ref 135–145)
TCO2: 33 mmol/L — ABNORMAL HIGH (ref 22–32)
TCO2: 35 mmol/L — ABNORMAL HIGH (ref 22–32)
pCO2 arterial: 49.8 mmHg — ABNORMAL HIGH (ref 32.0–48.0)
pCO2 arterial: 49.2 mmHg — ABNORMAL HIGH (ref 32.0–48.0)
pH, Arterial: 7.405 (ref 7.350–7.450)
pH, Arterial: 7.439 (ref 7.350–7.450)
pO2, Arterial: 43 mmHg — ABNORMAL LOW (ref 83.0–108.0)
pO2, Arterial: 71 mmHg — ABNORMAL LOW (ref 83.0–108.0)

## 2020-02-13 LAB — COMPREHENSIVE METABOLIC PANEL
ALT: 44 U/L (ref 0–44)
AST: 76 U/L — ABNORMAL HIGH (ref 15–41)
Albumin: 2.5 g/dL — ABNORMAL LOW (ref 3.5–5.0)
Alkaline Phosphatase: 72 U/L (ref 38–126)
Anion gap: 15 (ref 5–15)
BUN: 26 mg/dL — ABNORMAL HIGH (ref 6–20)
CO2: 28 mmol/L (ref 22–32)
Calcium: 6.7 mg/dL — ABNORMAL LOW (ref 8.9–10.3)
Chloride: 100 mmol/L (ref 98–111)
Creatinine, Ser: 0.86 mg/dL (ref 0.44–1.00)
GFR, Estimated: >60 mL/min (ref >60)
Glucose, Bld: 174 mg/dL — ABNORMAL HIGH (ref 70–99)
Potassium: 4.1 mmol/L (ref 3.5–5.1)
Sodium: 143 mmol/L (ref 135–145)
Total Bilirubin: 0.6 mg/dL (ref 0.3–1.2)
Total Protein: 6.5 g/dL (ref 6.5–8.1)

## 2020-02-13 LAB — GLUCOSE, CAPILLARY
Glucose-Capillary: 162 mg/dL — ABNORMAL HIGH (ref 70–99)
Glucose-Capillary: 165 mg/dL — ABNORMAL HIGH (ref 70–99)
Glucose-Capillary: 121 mg/dL — ABNORMAL HIGH (ref 70–99)
Glucose-Capillary: 210 mg/dL — ABNORMAL HIGH (ref 70–99)

## 2020-02-13 LAB — C-REACTIVE PROTEIN: CRP: 11.6 mg/dL — ABNORMAL HIGH (ref <1.0)

## 2020-02-13 LAB — ECHOCARDIOGRAM COMPLETE
Area-P 1/2: 2.39 cm2
Height: 59.016 in
S' Lateral: 3.53 cm
Weight: 4031.77 oz

## 2020-02-13 LAB — FERRITIN: Ferritin: 566 ng/mL — ABNORMAL HIGH (ref 11–307)

## 2020-02-13 LAB — D-DIMER, QUANTITATIVE: D-Dimer, Quant: 1.85 ug/mL-FEU — ABNORMAL HIGH (ref 0.00–0.50)

## 2020-02-13 LAB — HEPARIN LEVEL (UNFRACTIONATED): Heparin Unfractionated: 0.68 IU/mL (ref 0.30–0.70)

## 2020-02-13 MED ORDER — HEPARIN BOLUS VIA INFUSION
4000.0000 [IU] | Freq: Once | INTRAVENOUS | Status: AC
  Administered 2020-02-13: 4000 [IU] via INTRAVENOUS
  Filled 2020-02-13: qty 4000

## 2020-02-13 MED ORDER — SODIUM CHLORIDE 0.9 % IV SOLN: INTRAVENOUS | Status: DC | PRN

## 2020-02-13 MED ORDER — HEPARIN (PORCINE) 25000 UT/250ML-% IV SOLN
800.0000 [IU]/h | INTRAVENOUS | Status: DC
  Administered 2020-02-13: 1400 [IU]/h via INTRAVENOUS
  Administered 2020-02-14 – 2020-02-15 (×2): 1200 [IU]/h via INTRAVENOUS
  Administered 2020-02-17: 1250 [IU]/h via INTRAVENOUS
  Administered 2020-02-17: 1200 [IU]/h via INTRAVENOUS
  Administered 2020-02-18: 1100 [IU]/h via INTRAVENOUS
  Filled 2020-02-13 (×7): qty 250

## 2020-02-13 MED ORDER — CHLORHEXIDINE GLUCONATE CLOTH 2 % EX PADS
6.0000 | MEDICATED_PAD | Freq: Every day | CUTANEOUS | Status: DC
  Administered 2020-02-13 – 2020-02-25 (×12): 6 via TOPICAL

## 2020-02-13 MED ORDER — MELATONIN 3 MG PO TABS
3.0000 mg | ORAL_TABLET | Freq: Every day | ORAL | Status: DC
  Administered 2020-02-13 – 2020-02-22 (×9): 3 mg via ORAL
  Filled 2020-02-13 (×9): qty 1

## 2020-02-13 MED ORDER — PNEUMOCOCCAL VAC POLYVALENT 25 MCG/0.5ML IJ INJ
0.5000 mL | INJECTION | INTRAMUSCULAR | Status: AC
  Administered 2020-02-14: 0.5 mL via INTRAMUSCULAR
  Filled 2020-02-13: qty 0.5

## 2020-02-13 MED ORDER — CLONAZEPAM 0.5 MG PO TBDP
0.5000 mg | ORAL_TABLET | Freq: Two times a day (BID) | ORAL | Status: DC | PRN
  Administered 2020-02-13 – 2020-02-22 (×10): 0.5 mg via ORAL
  Filled 2020-02-13 (×10): qty 1

## 2020-02-13 NOTE — Progress Notes (Signed)
  Echocardiogram 2D Echocardiogram has been performed.  Ryszard Socarras G Rachana Malesky 02/13/2020, 10:59 AM

## 2020-02-13 NOTE — Progress Notes (Addendum)
ANTICOAGULATION CONSULT NOTE - Follow Up Consult  Pharmacy Consult for heparin Indication: r/o PE  Labs: Recent Labs    03/06/2020 1315 03/06/20 1315 02/12/20 0623 02/13/20 0500 02/13/20 0500 02/13/20 1132 02/13/20 1523 02/13/20 2230  HGB 14.8   < >  --  14.5   < > 13.9 13.9  --   HCT 47.1*   < >  --  46.7*  --  41.0 41.0  --   PLT 198  --   --  241  --   --   --   --   HEPARINUNFRC  --   --   --   --   --   --   --  0.68  CREATININE 0.83  --  0.90 0.86  --   --   --   --    < > = values in this interval not displayed.    Assessment/Plan:  49yo female therapeutic on heparin with initial dosing for possible PE. Will continue gtt at current rate and confirm stable with am labs.   Vernard Gambles, PharmD, BCPS  02/13/2020,11:51 PM    Addendum: Heparin level now above goal at 0.85.  Will decrease heparin gtt by 1-2 units/kg/hr to 1200 units/hr and check level in 6 hours. VB 02/14/2020 2:42 AM

## 2020-02-13 NOTE — Progress Notes (Addendum)
ANTICOAGULATION CONSULT NOTE - Follow Up Consult  Pharmacy Consult for Heparin Indication: rule out pulmonary embolus  No Known Allergies  Patient Measurements: Height: 4' 11.02" (149.9 cm) Weight: 114.3 kg (251 lb 15.8 oz) IBW/kg (Calculated) : 43.24 Heparin Dosing Weight: 74 kg  Vital Signs: Temp: 97.9 F (36.6 C) (10/19 1155) Temp Source: Axillary (10/19 1155) BP: 116/96 (10/19 1100) Pulse Rate: 72 (10/19 1400)  Labs: Recent Labs    02/08/2020 1315 01/29/2020 1315 02/12/20 0623 02/13/20 0500 02/13/20 1132  HGB 14.8   < >  --  14.5 13.9  HCT 47.1*  --   --  46.7* 41.0  PLT 198  --   --  241  --   CREATININE 0.83  --  0.90 0.86  --    < > = values in this interval not displayed.    Estimated Creatinine Clearance: 87.5 mL/min (by C-G formula based on SCr of 0.86 mg/dL).  Assessment: 49 year old female with COVID pneumonia to begin empiric heparin for pulmonary embolus - last dose of Lovenox 10/18  Goal of Therapy:  Heparin level 0.3-0.7 units/ml Monitor platelets by anticoagulation protocol: Yes   Plan:  Heparin 4000 units iv bolus x 1  Heparin drip at 1400 units / hr Heparin level at 22:30  pm Daily heparin level, CBC  Thank you Okey Regal, PharmD [phone] 02/13/2020,2:38 PM

## 2020-02-13 NOTE — Progress Notes (Signed)
PCCM interval progress note:  Pt transitioned to Bipap and is tolerating well. Echo showed apical hyperkinesis with positive McConnell sign which can be associated with PE.  Will start empiric heparin and obtain LE dopplers.   Darcella Gasman Rodneisha Bonnet, PA-C

## 2020-02-13 NOTE — Progress Notes (Signed)
NAME:  Brianna Velasquez, MRN:  [id number], DOB:  1970/09/19, LOS: 2 ADMISSION DATE:  03-10-20, CONSULTATION DATE: 03/10/2020 REFERRING MD: Dr. Lockie Mola, CHIEF COMPLAINT: Hypoxemic respiratory failure  Brief History   49 year old obese woman, diagnosed with COVID-19 4 days PTA with approximately 1 week of symptoms at presentation.  Found to have hypoxemic respiratory failure, initially on BiPAP, subsequently comfortable but marginal on 30 L/min HF [place] +1.00 mask.   History of present illness   49 year old nonvaccinated morbidly obese woman with a history of tobacco use, hyperlipidemia, depression, hypothyroidism.  She began to experience upper respiratory symptoms approximately 1 week ago - cough with some sputum production, HA, change in taste, mild diarrhea. Multiple other family members ill as well, tested positive.  She underwent COVID-19 testing 4 days ago, positive by her report.  She has developed progressive dyspnea and was found to be cyanotic 10/17.  EMS activated an SPO2 70%, improved to upper 80s on BiPAP in transit.  In the ED transitioned back to 15 L/min high flow nasal cannula +1.00 mask, SPO2 in the high 80s percent, respiratory pattern comfortable.  Dexamethasone initiated, single dose remdesivir given in the ED.  Chest x-ray shows diffuse bilateral base predominant pulmonary infiltrates in an ARDS pattern.  Past Medical History  Renal calculi Hypothyroidism Depression MRSA infection following cesarean section  Significant Hospital Events   Brought to ED on BiPAP, hypoxemic 10/17, admitted ICU level care  Consults:    Procedures:     Significant Diagnostic Tests:      Micro Data:  SARSCoV2 10/17 >> positive 10/17 MRSA PCR >> neg 10/17 BCx 2 >>  Antimicrobials:  Remdesivir x1 10/17  Baricitinib 10/17 >> Dexamethasone 10/17 >>   Interim history/subjective:  Pt was able to tolerate prone positioning all night, though is requiring increasing oxygen via  HFNC/non-rebreather to maintain sats 84-88%.  Denies chest or LE pain  Objective   Blood pressure 119/75, pulse (!) 108, temperature 97.9 F (36.6 C), temperature source Axillary, resp. rate (!) 23, height 4' 11.02" (1.499 m), weight 114.3 kg, SpO2 (!) 56 %.    FiO2 (%):  [100 %] 100 %   Intake/Output Summary (Last 24 hours) at 02/13/2020 0743 Last data filed at 02/13/2020 0600 Gross per 24 hour  Intake 716.84 ml  Output 600 ml  Net 116.84 ml   Filed Weights   10-Mar-2020 1254 03-10-2020 2319 02/13/20 0459  Weight: 111.1 kg 112.8 kg 114.3 kg   General:  Obese F, sleeping in prone position, arouses to voice and in no distress HEENT: MM pink/moist Neuro: slightly somnolent, though arousable and oriented, conversational and following commands CV: s1s2 rrr, no m/r/g PULM:  Mildly tachypneic, no wheezing, decreased air movement in bilateral bases GI: soft, bsx4 active  Extremities: warm/dry, 1+ edema  Skin: no rashes or lesions   Resolved Hospital Problem list     Assessment & Plan:   Acute hypoxemic respiratory failure due to COVID-19 pneumonia and ARDS Currently in no distress, though is requiring maximal support via HFNC with 60L and 100% FiO2. Remains high risk for intubation P: -Transition to Bipap, per nursing did slightly better when sitting up in the chair, see if she can tolerate -Goal O2 saturations for goal SpO2 > 85% -Monitor for worsening mental status, increasing respiratory distress, suspect will need intubated -continue proning as able -pulmonary toilet -continue Baricitinib and Steroids, trend inflammatory markers, Remdesevir deferred  -Albuterol -If oxygen sats <85% on Bipap check ABG -CXR pending, Check echo, may benefit  from diuresis -afebrile without leukocytosis, likely solely viral infection defer CAP coverage    Hyperglycemia,  exacerbated with steroids  P: -Continuie resistant scale SSI today, trend glucose and add Levemir if needed     Hypothyroidism -Continue synthroid and check baseline TSH and T4  Hyperlipidemia  -continue holding home zetia and lipitor    Best practice:  Diet: start soft diet  Pain/Anxiety/Delirium protocol (if indicated): low dose Klonopin VAP protocol (if indicated): NA DVT prophylaxis: enoxaparin  GI prophylaxis: pepcid Glucose control: SSI  Mobility: progress as tolerated  Code Status: Full  Family Communication: Pt's mother Verdell Face updated via phone 10/19 Disposition: ICU  Labs   CBC: Recent Labs  Lab 02/07/2020 1315 01/28/2020 1752 02/13/20 0500  WBC 5.3 4.7 6.1  NEUTROABS 4.6  --   --   HGB 14.8 15.1* 14.5  HCT 47.1* 48.4* 46.7*  MCV 93.1 94.2 94.2  PLT 198 195 241    Basic Metabolic Panel: Recent Labs  Lab 02/05/2020 1315 02/12/20 0623  NA 138 143  K 3.7 4.4  CL 100 102  CO2 24 29  GLUCOSE 226* 182*  BUN 11 21*  CREATININE 0.83 0.90  CALCIUM 6.3* 6.3*   GFR: Estimated Creatinine Clearance: 83.6 mL/min (by C-G formula based on SCr of 0.9 mg/dL). Recent Labs  Lab 02/10/2020 1315 02/05/2020 1752 02/13/20 0500  PROCALCITON 0.24  --   --   WBC 5.3 4.7 6.1  LATICACIDVEN 3.1* 1.9  --     Liver Function Tests: Recent Labs  Lab 02/23/2020 1315 02/12/20 0623  AST 74* 83*  ALT 44 47*  ALKPHOS 72 66  BILITOT 0.6 0.6  PROT 6.8 6.5  ALBUMIN 2.8* 2.5*   No results for input(s): LIPASE, AMYLASE in the last 168 hours. No results for input(s): AMMONIA in the last 168 hours.  ABG No results found for: PHART, PCO2ART, PO2ART, HCO3, TCO2, ACIDBASEDEF, O2SAT   Coagulation Profile: No results for input(s): INR, PROTIME in the last 168 hours.  Cardiac Enzymes: No results for input(s): CKTOTAL, CKMB, CKMBINDEX, TROPONINI in the last 168 hours.  HbA1C: No results found for: HGBA1C  CBG: Recent Labs  Lab 02/12/20 0402 02/12/20 0739 02/12/20 1234 02/12/20 1647 02/12/20 2112  GLUCAP 165* 162* 151* 161* 198*    Critical care time: 40 minutes      CRITICAL CARE Performed by: Darcella Gasman Tomicka Lover   Total critical care time: 40 minutes  Critical care time was exclusive of separately billable procedures and treating other patients.  Critical care was necessary to treat or prevent imminent or life-threatening deterioration.  Critical care was time spent personally by me on the following activities: development of treatment plan with patient and/or surrogate as well as nursing, discussions with consultants, evaluation of patient's response to treatment, examination of patient, obtaining history from patient or surrogate, ordering and performing treatments and interventions, ordering and review of laboratory studies, ordering and review of radiographic studies, pulse oximetry and re-evaluation of patient's condition.   Darcella Gasman Jru Pense, PA-C [place] [facility]  Pager# [phone], if no answer [phone]

## 2020-02-14 ENCOUNTER — Inpatient Hospital Stay (HOSPITAL_COMMUNITY): Payer: Medicaid Other

## 2020-02-14 DIAGNOSIS — J80 Acute respiratory distress syndrome: Secondary | ICD-10-CM | POA: Diagnosis not present

## 2020-02-14 DIAGNOSIS — U071 COVID-19: Secondary | ICD-10-CM | POA: Diagnosis not present

## 2020-02-14 LAB — BASIC METABOLIC PANEL
Anion gap: 12 (ref 5–15)
BUN: 21 mg/dL — ABNORMAL HIGH (ref 6–20)
CO2: 28 mmol/L (ref 22–32)
Calcium: 6.3 mg/dL — CL (ref 8.9–10.3)
Chloride: 102 mmol/L (ref 98–111)
Creatinine, Ser: 0.72 mg/dL (ref 0.44–1.00)
GFR, Estimated: >60 mL/min (ref >60)
Glucose, Bld: 178 mg/dL — ABNORMAL HIGH (ref 70–99)
Potassium: 4.3 mmol/L (ref 3.5–5.1)
Sodium: 142 mmol/L (ref 135–145)

## 2020-02-14 LAB — C-REACTIVE PROTEIN: CRP: 7.9 mg/dL — ABNORMAL HIGH (ref <1.0)

## 2020-02-14 LAB — HEPARIN LEVEL (UNFRACTIONATED)
Heparin Unfractionated: 0.85 IU/mL — ABNORMAL HIGH (ref 0.30–0.70)
Heparin Unfractionated: 0.6 IU/mL (ref 0.30–0.70)

## 2020-02-14 LAB — CBC
HCT: 43 % (ref 36.0–46.0)
Hemoglobin: 13.6 g/dL (ref 12.0–15.0)
MCH: 29.4 pg (ref 26.0–34.0)
MCHC: 31.6 g/dL (ref 30.0–36.0)
MCV: 93.1 fL (ref 80.0–100.0)
Platelets: 249 10*3/uL (ref 150–400)
RBC: 4.62 MIL/uL (ref 3.87–5.11)
RDW: 14.6 % (ref 11.5–15.5)
WBC: 9.8 10*3/uL (ref 4.0–10.5)
nRBC: 0 % (ref 0.0–0.2)

## 2020-02-14 LAB — GLUCOSE, CAPILLARY
Glucose-Capillary: 146 mg/dL — ABNORMAL HIGH (ref 70–99)
Glucose-Capillary: 151 mg/dL — ABNORMAL HIGH (ref 70–99)
Glucose-Capillary: 164 mg/dL — ABNORMAL HIGH (ref 70–99)
Glucose-Capillary: 172 mg/dL — ABNORMAL HIGH (ref 70–99)
Glucose-Capillary: 194 mg/dL — ABNORMAL HIGH (ref 70–99)

## 2020-02-14 LAB — TSH: TSH: 0.747 u[IU]/mL (ref 0.350–4.500)

## 2020-02-14 LAB — FERRITIN: Ferritin: 514 ng/mL — ABNORMAL HIGH (ref 11–307)

## 2020-02-14 LAB — D-DIMER, QUANTITATIVE: D-Dimer, Quant: 1.92 ug/mL-FEU — ABNORMAL HIGH (ref 0.00–0.50)

## 2020-02-14 LAB — LACTATE DEHYDROGENASE: LDH: 1149 U/L — ABNORMAL HIGH (ref 98–192)

## 2020-02-14 MED ORDER — CALCIUM GLUCONATE-NACL 2-0.675 GM/100ML-% IV SOLN
2.0000 g | Freq: Once | INTRAVENOUS | Status: AC
  Administered 2020-02-14: 2000 mg via INTRAVENOUS
  Filled 2020-02-14: qty 100

## 2020-02-14 MED ORDER — SENNOSIDES-DOCUSATE SODIUM 8.6-50 MG PO TABS
1.0000 | ORAL_TABLET | Freq: Two times a day (BID) | ORAL | Status: DC
  Administered 2020-02-14 – 2020-02-26 (×24): 1 via ORAL
  Filled 2020-02-14 (×24): qty 1

## 2020-02-14 MED ORDER — INSULIN DETEMIR 100 UNIT/ML ~~LOC~~ SOLN
5.0000 [IU] | Freq: Two times a day (BID) | SUBCUTANEOUS | Status: DC
  Administered 2020-02-14 – 2020-02-21 (×16): 5 [IU] via SUBCUTANEOUS
  Filled 2020-02-14 (×18): qty 0.05

## 2020-02-14 NOTE — Progress Notes (Signed)
RT and RN tried to wean patient off BiPAP to HHFNC.  Patient satting 92-94 proned on BiPAP.  When weaned to [facility] 55L 100% and supine patient desatted to the high 70's.  Patient reproned and placed back on BiPAP.  Sat's at 93%.  RT will continue to monitor.

## 2020-02-14 NOTE — Progress Notes (Addendum)
NAME:  Brianna Velasquez, MRN:  [id number], DOB:  Mar 30, 1971, LOS: 3 ADMISSION DATE:  2020/03/09, CONSULTATION DATE: March 09, 2020 REFERRING MD: Dr. Lockie Mola, CHIEF COMPLAINT: Hypoxemic respiratory failure  Brief History   49 year old obese woman, diagnosed with COVID-19 4 days PTA with approximately 1 week of symptoms at presentation.  Found to have hypoxemic respiratory failure, initially on BiPAP, subsequently comfortable but marginal on 30 L/min HF [place] +1.00 mask.   History of present illness   49 year old nonvaccinated morbidly obese woman with a history of tobacco use, hyperlipidemia, depression, hypothyroidism.  She began to experience upper respiratory symptoms approximately 1 week ago - cough with some sputum production, HA, change in taste, mild diarrhea. Multiple other family members ill as well, tested positive.  She underwent COVID-19 testing 4 days ago, positive by her report.  She has developed progressive dyspnea and was found to be cyanotic 10/17.  EMS activated an SPO2 70%, improved to upper 80s on BiPAP in transit.  In the ED transitioned back to 15 L/min high flow nasal cannula +1.00 mask, SPO2 in the high 80s percent, respiratory pattern comfortable.  Dexamethasone initiated, single dose remdesivir given in the ED.  Chest x-ray shows diffuse bilateral base predominant pulmonary infiltrates in an ARDS pattern.  Past Medical History  Renal calculi Hypothyroidism Depression MRSA infection following cesarean section  Significant Hospital Events   Brought to ED on BiPAP, hypoxemic 10/17, admitted ICU level care  Consults:    Procedures:     Significant Diagnostic Tests:  10/19 CXR>>Stable diffuse bilateral lung opacities are noted concerning for multifocal pneumonia. 10/19 Echo>>RV Apical Hyperkinesis relative to the rest of the RV. This can be associated with Pulmonary Embolism, Grade 1 diastolic HF  LE dopplers>>     Micro Data:  SARSCoV2 10/17 >> positive 10/17  MRSA PCR >> neg 10/17 BCx 2 >>  Antimicrobials:   Remdesivir x1 10/17 Baricitinib 10/17 >> Dexamethasone 10/17 >>   Interim history/subjective:  Improved clinically on Bipap, tolerated mask overnight and able to prone  Objective   Blood pressure 110/71, pulse 64, temperature 98.1 F (36.7 C), temperature source Axillary, resp. rate (!) 23, height 4' 11.02" (1.499 m), weight 114.6 kg, SpO2 94 %.    Vent Mode: BIPAP;PCV FiO2 (%):  [100 %] 100 % Set Rate:  [12 bmp] 12 bmp PEEP:  [8 cmH20] 8 cmH20   Intake/Output Summary (Last 24 hours) at 02/14/2020 0709 Last data filed at 02/14/2020 0600 Gross per 24 hour  Intake 742.33 ml  Output 1000 ml  Net -257.67 ml   Filed Weights   March 09, 2020 2319 02/13/20 0459 02/14/20 0424  Weight: 112.8 kg 114.3 kg 114.6 kg   General:  Obese F, continues on Bipap, arousable and in no distress HEENT: MM pink/moist Neuro: alert and oriented CV: s1s2 rrr, no m/r/g PULM: on Bipap 20/12 100% fiO2, generalized poor air movement without wheezing or rhonchi GI: soft, bsx4 active  Extremities: warm/dry, 1+ edema  Skin: no rashes or lesions   Resolved Hospital Problem list     Assessment & Plan:   Acute hypoxemic respiratory failure due to COVID-19 pneumonia and ARDS Suspect underlying OHS, Currently in no distress, though is requiring maximal support via HFNC with 60L and 100% FiO2. Remains high risk for intubation P: -Try Bipap qhs and prn and trial periods back on HFNC tpdau -Goal O2 saturations for goal SpO2 > 85% -Monitor for worsening mental status, increasing respiratory distress, suspect will need intubated -continue proning as able -pulmonary toilet -continue  Baricitinib and Steroids, trend inflammatory markers, Remdesevir deferred  -Albuterol -If oxygen sats <85% on Bipap check ABG   Concern for Pulmonary Embolism Positive McConnell's sign on Echo P: -Continue empiric heparin -LE dopplers are pending    Hyperglycemia,    exacerbated with steroids  P: -Continue SSI, add Levemir 5 units bid    Hypocalcemia -suspect 2/2 critical illness -Repleted, follow electrolytes  Hypothyroidism -Continue synthroid, TSH WNL  Hyperlipidemia  -continue holding home zetia and lipitor    Best practice:  Diet: start soft diet  Pain/Anxiety/Delirium protocol (if indicated): low dose Klonopin VAP protocol (if indicated): NA DVT prophylaxis: enoxaparin  GI prophylaxis: pepcid Glucose control: SSI  Mobility: progress as tolerated  Code Status: Full  Family Communication: Family update pending Disposition: ICU  Labs   CBC: Recent Labs  Lab 02/10/2020 1315 02/13/20 0500 02/13/20 1132 02/13/20 1523 02/14/20 0157  WBC 5.3 6.1  --   --  9.8  NEUTROABS 4.6  --   --   --   --   HGB 14.8 14.5 13.9 13.9 13.6  HCT 47.1* 46.7* 41.0 41.0 43.0  MCV 93.1 94.2  --   --  93.1  PLT 198 241  --   --  249    Basic Metabolic Panel: Recent Labs  Lab 02/01/2020 1315 02/05/2020 1315 02/12/20 0623 02/13/20 0500 02/13/20 1132 02/13/20 1523 02/14/20 0157  NA 138   < > 143 143 140 140 142  K 3.7   < > 4.4 4.1 3.8 3.9 4.3  CL 100  --  102 100  --   --  102  CO2 24  --  29 28  --   --  28  GLUCOSE 226*  --  182* 174*  --   --  178*  BUN 11  --  21* 26*  --   --  21*  CREATININE 0.83  --  0.90 0.86  --   --  0.72  CALCIUM 6.3*  --  6.3* 6.7*  --   --  6.3*   < > = values in this interval not displayed.   GFR: Estimated Creatinine Clearance: 94.3 mL/min (by C-G formula based on SCr of 0.72 mg/dL). Recent Labs  Lab 02/13/2020 1315 02/01/2020 1752 02/13/20 0500 02/14/20 0157  PROCALCITON 0.24  --   --   --   WBC 5.3  --  6.1 9.8  LATICACIDVEN 3.1* 1.9  --   --     Liver Function Tests: Recent Labs  Lab 02/13/2020 1315 02/12/20 0623 02/13/20 0500  AST 74* 83* 76*  ALT 44 47* 44  ALKPHOS 72 66 72  BILITOT 0.6 0.6 0.6  PROT 6.8 6.5 6.5  ALBUMIN 2.8* 2.5* 2.5*   No results for input(s): LIPASE, AMYLASE in the  last 168 hours. No results for input(s): AMMONIA in the last 168 hours.  ABG    Component Value Date/Time   PHART 7.439 02/13/2020 1523   PCO2ART 49.2 (H) 02/13/2020 1523   PO2ART 71 (L) 02/13/2020 1523   HCO3 33.4 (H) 02/13/2020 1523   TCO2 35 (H) 02/13/2020 1523   O2SAT 94.0 02/13/2020 1523     Coagulation Profile: No results for input(s): INR, PROTIME in the last 168 hours.  Cardiac Enzymes: No results for input(s): CKTOTAL, CKMB, CKMBINDEX, TROPONINI in the last 168 hours.  HbA1C: No results found for: HGBA1C  CBG: Recent Labs  Lab 02/12/20 2112 02/13/20 0751 02/13/20 1154 02/13/20 1614 02/13/20 2113  GLUCAP 198* 162* 165*  121* 210*    Critical care time: 35 minutes     CRITICAL CARE Performed by: Darcella Gasman Haynes Giannotti   Total critical care time: 35 minutes  Critical care time was exclusive of separately billable procedures and treating other patients.  Critical care was necessary to treat or prevent imminent or life-threatening deterioration.  Critical care was time spent personally by me on the following activities: development of treatment plan with patient and/or surrogate as well as nursing, discussions with consultants, evaluation of patient's response to treatment, examination of patient, obtaining history from patient or surrogate, ordering and performing treatments and interventions, ordering and review of laboratory studies, ordering and review of radiographic studies, pulse oximetry and re-evaluation of patient's condition.   Darcella Gasman Nyonna Hargrove, PA-C [facility]  Pager# [phone], if no answer [phone]

## 2020-02-14 NOTE — Progress Notes (Signed)
eLink Physician-Brief Progress Note Patient Name: Brianna Velasquez DOB: 1970/07/22 MRN: [id number]   Date of Service  02/14/2020  HPI/Events of Note  Calcium = 6.3.   eICU Interventions  Will replace Ca++.     Intervention Category Major Interventions: Electrolyte abnormality - evaluation and management  Rajesh Wyss [place] 02/14/2020, 3:14 AM

## 2020-02-14 NOTE — Progress Notes (Signed)
RN spoke with Vinnie Langton, RN regarding morning ABG order.  Patient currently proned on BiPAP. RT will get ABG after patient has been supine.  RN aware.

## 2020-02-14 NOTE — Progress Notes (Addendum)
ANTICOAGULATION CONSULT NOTE - Follow Up Consult  Pharmacy Consult for Heparin Indication: rule out pulmonary embolus  No Known Allergies  Patient Measurements: Height: 4' 11.02" (149.9 cm) Weight: 114.6 kg (252 lb 10.4 oz) IBW/kg (Calculated) : 43.24 Heparin Dosing Weight: 74 kg  Vital Signs: Temp: 98 F (36.7 C) (10/20 1135) Temp Source: Axillary (10/20 1135) BP: 121/59 (10/20 1137) Pulse Rate: 66 (10/20 1137)  Labs: Recent Labs    02/12/2020 1315 02/03/2020 1315 02/12/20 0623 02/13/20 0500 02/13/20 0500 02/13/20 1132 02/13/20 1132 02/13/20 1523 02/13/20 2230 02/14/20 0157 02/14/20 0158 02/14/20 0900  HGB 14.8   < >  --  14.5   < > 13.9   < > 13.9  --  13.6  --   --   HCT 47.1*   < >  --  46.7*   < > 41.0  --  41.0  --  43.0  --   --   PLT 198  --   --  241  --   --   --   --   --  249  --   --   HEPARINUNFRC  --   --   --   --   --   --   --   --  0.68  --  0.85* 0.60  CREATININE 0.83   < > 0.90 0.86  --   --   --   --   --  0.72  --   --    < > = values in this interval not displayed.    Estimated Creatinine Clearance: 94.3 mL/min (by C-G formula based on SCr of 0.72 mg/dL).  Assessment: 49 year old female with COVID pneumonia to begin empiric heparin for pulmonary embolus - last dose of Lovenox 10/18. Heparin level 0.6 at goal.  Goal of Therapy:  Heparin level 0.3-0.7 units/ml Monitor platelets by anticoagulation protocol: Yes   Plan:  Continue heparin drip at 1200 units / hr Daily heparin level, CBC  Filbert Schilder  [facility] PharmD Candidate 2022 02/14/2020,11:54 AM

## 2020-02-14 NOTE — Progress Notes (Signed)
CRITICAL VALUE ALERT  Critical Value:  Ca+ 6.3  Date & Time Notied:  02/14/20 0253  Provider Notified: Glean Salvo MD  Orders Received/Actions taken: TBD

## 2020-02-15 ENCOUNTER — Inpatient Hospital Stay (HOSPITAL_COMMUNITY): Payer: Medicaid Other

## 2020-02-15 DIAGNOSIS — J80 Acute respiratory distress syndrome: Secondary | ICD-10-CM | POA: Diagnosis not present

## 2020-02-15 DIAGNOSIS — R609 Edema, unspecified: Secondary | ICD-10-CM | POA: Diagnosis not present

## 2020-02-15 DIAGNOSIS — U071 COVID-19: Secondary | ICD-10-CM | POA: Diagnosis not present

## 2020-02-15 LAB — BASIC METABOLIC PANEL
Anion gap: 12 (ref 5–15)
BUN: 20 mg/dL (ref 6–20)
CO2: 29 mmol/L (ref 22–32)
Calcium: 6.7 mg/dL — ABNORMAL LOW (ref 8.9–10.3)
Chloride: 103 mmol/L (ref 98–111)
Creatinine, Ser: 0.78 mg/dL (ref 0.44–1.00)
GFR, Estimated: >60 mL/min (ref >60)
Glucose, Bld: 171 mg/dL — ABNORMAL HIGH (ref 70–99)
Potassium: 4.6 mmol/L (ref 3.5–5.1)
Sodium: 144 mmol/L (ref 135–145)

## 2020-02-15 LAB — CBC
HCT: 43.3 % (ref 36.0–46.0)
Hemoglobin: 13.6 g/dL (ref 12.0–15.0)
MCH: 29.5 pg (ref 26.0–34.0)
MCHC: 31.4 g/dL (ref 30.0–36.0)
MCV: 93.9 fL (ref 80.0–100.0)
Platelets: 283 10*3/uL (ref 150–400)
RBC: 4.61 MIL/uL (ref 3.87–5.11)
RDW: 14.4 % (ref 11.5–15.5)
WBC: 6.2 10*3/uL (ref 4.0–10.5)
nRBC: 0.3 % — ABNORMAL HIGH (ref 0.0–0.2)

## 2020-02-15 LAB — FERRITIN: Ferritin: 334 ng/mL — ABNORMAL HIGH (ref 11–307)

## 2020-02-15 LAB — LACTATE DEHYDROGENASE: LDH: 990 U/L — ABNORMAL HIGH (ref 98–192)

## 2020-02-15 LAB — GLUCOSE, CAPILLARY
Glucose-Capillary: 182 mg/dL — ABNORMAL HIGH (ref 70–99)
Glucose-Capillary: 147 mg/dL — ABNORMAL HIGH (ref 70–99)
Glucose-Capillary: 138 mg/dL — ABNORMAL HIGH (ref 70–99)
Glucose-Capillary: 109 mg/dL — ABNORMAL HIGH (ref 70–99)

## 2020-02-15 LAB — MAGNESIUM: Magnesium: 2.3 mg/dL (ref 1.7–2.4)

## 2020-02-15 LAB — C-REACTIVE PROTEIN: CRP: 6.6 mg/dL — ABNORMAL HIGH (ref <1.0)

## 2020-02-15 LAB — HEPARIN LEVEL (UNFRACTIONATED): Heparin Unfractionated: 0.6 IU/mL (ref 0.30–0.70)

## 2020-02-15 LAB — D-DIMER, QUANTITATIVE: D-Dimer, Quant: 1.69 ug/mL-FEU — ABNORMAL HIGH (ref 0.00–0.50)

## 2020-02-15 MED ORDER — CALCIUM GLUCONATE-NACL 2-0.675 GM/100ML-% IV SOLN
2.0000 g | Freq: Once | INTRAVENOUS | Status: AC
  Administered 2020-02-15: 2000 mg via INTRAVENOUS
  Filled 2020-02-15: qty 100

## 2020-02-15 NOTE — Progress Notes (Signed)
NAME:  Brianna Velasquez, MRN:  [id number], DOB:  June 12, 1970, LOS: 4 ADMISSION DATE:  01/31/2020, CONSULTATION DATE: 02/19/2020 REFERRING MD: Dr. Lockie Mola, CHIEF COMPLAINT: Hypoxemic respiratory failure  Brief History   50 year old obese woman, diagnosed with COVID-19 4 days PTA with approximately 1 week of symptoms at presentation.  Found to have hypoxemic respiratory failure, initially on BiPAP, subsequently comfortable but marginal on 30 L/min HF [place] +1.00 mask.   History of present illness   49 year old nonvaccinated morbidly obese woman with a history of tobacco use, hyperlipidemia, depression, hypothyroidism.  She began to experience upper respiratory symptoms approximately 1 week ago - cough with some sputum production, HA, change in taste, mild diarrhea. Multiple other family members ill as well, tested positive.  She underwent COVID-19 testing 4 days ago, positive by her report.  She has developed progressive dyspnea and was found to be cyanotic 10/17.  EMS activated an SPO2 70%, improved to upper 80s on BiPAP in transit.  In the ED transitioned back to 15 L/min high flow nasal cannula +1.00 mask, SPO2 in the high 80s percent, respiratory pattern comfortable.  Dexamethasone initiated, single dose remdesivir given in the ED.  Chest x-ray shows diffuse bilateral base predominant pulmonary infiltrates in an ARDS pattern.  Past Medical History  Renal calculi Hypothyroidism Depression MRSA infection following cesarean section  Significant Hospital Events   Brought to ED on BiPAP, hypoxemic 10/17, admitted ICU level care  Consults:    Procedures:     Significant Diagnostic Tests:  10/19 CXR>>Stable diffuse bilateral lung opacities are noted concerning for multifocal pneumonia. 10/19 Echo>>RV Apical Hyperkinesis relative to the rest of the RV. This can be associated with Pulmonary Embolism, Grade 1 diastolic HF  LE dopplers>>     Micro Data:  SARSCoV2 10/17 >> positive 10/17  MRSA PCR >> neg 10/17 BCx 2 >>  Antimicrobials:  Remdesivir x1 10/17 Baricitinib 10/17 >> Dexamethasone 10/17 >>   Interim history/subjective:  Not able to tolerate HFNC for significant periods of time yesterday, back on Bipap, no overnight events   Objective   Blood pressure (!) 138/98, pulse (!) 59, temperature 97.8 F (36.6 C), temperature source Axillary, resp. rate (!) 24, height 4' 11.02" (1.499 m), weight 114.8 kg, SpO2 92 %.    Vent Mode: PCV;BIPAP FiO2 (%):  [80 %-100 %] 100 % Set Rate:  [12 bmp-18 bmp] 18 bmp PEEP:  [8 cmH20-12 cmH20] 12 cmH20   Intake/Output Summary (Last 24 hours) at 02/15/2020 0745 Last data filed at 02/15/2020 0600 Gross per 24 hour  Intake 739.51 ml  Output --  Net 739.51 ml   Filed Weights   02/13/20 0459 02/14/20 0424 02/15/20 0500  Weight: 114.3 kg 114.6 kg 114.8 kg   General:  Resting in no distress, arousable, non-toxic appearing HEENT: MM pink/moist Neuro: alert and oriented CV: s1s2 rrr, no m/r/g PULM: on Bipap 20/12 80% fiO2, increased bilateral breath sounds from yesterday without wheezing or rhonchi GI: soft, bsx4 active  Extremities: warm/dry, 1+ edema   Skin: no rashes or lesions   Resolved Hospital Problem list     Assessment & Plan:   Acute hypoxemic respiratory failure due to COVID-19 pneumonia and ARDS Suspect underlying OHS, Requiring bipap. Remains high risk for intubation P: -Unable to transition off bipap yesterday, appears comfortable with improved breath sounds today, turn down FiO2 to 80%, again trial OOB and HFNC today if patient can tolerate -Goal O2 saturations for goal SpO2 > 85% -Monitor for worsening mental status, increasing respiratory  distress, remains high risk of intubation -continue proning as able -pulmonary toilet -continue Baricitinib and Steroids, trend inflammatory markers, Remdesevir deferred  -Remains afebrile without leukocytosis, low suspicion secondary bacterial PNA right  now -Albuterol -If oxygen sats <85% on Bipap check ABG   Concern for Pulmonary Embolism Positive McConnell's sign on Echo P: -Continue empiric heparin -follow LE dopplers  Hyperglycemia,  exacerbated with steroids  P: -Continue SSI, add Levemir 5 units bid    Hypocalcemia -suspect 2/2 critical illness -Repleted, follow electrolytes  Hypothyroidism -Continue synthroid, TSH WNL  Hyperlipidemia  -continue holding home zetia and lipitor    Best practice:  Diet: start soft diet  Pain/Anxiety/Delirium protocol (if indicated): low dose Klonopin VAP protocol (if indicated): NA DVT prophylaxis: enoxaparin  GI prophylaxis: pepcid Glucose control: SSI  Mobility: progress as tolerated  Code Status: Full  Family Communication: Updated Daughter [name] 10/21 Disposition: ICU  Labs   CBC: Recent Labs  Lab 02/23/2020 1315 02/22/2020 1315 02/13/20 0500 02/13/20 1132 02/13/20 1523 02/14/20 0157 02/15/20 0500  WBC 5.3  --  6.1  --   --  9.8 6.2  NEUTROABS 4.6  --   --   --   --   --   --   HGB 14.8   < > 14.5 13.9 13.9 13.6 13.6  HCT 47.1*   < > 46.7* 41.0 41.0 43.0 43.3  MCV 93.1  --  94.2  --   --  93.1 93.9  PLT 198  --  241  --   --  249 283   < > = values in this interval not displayed.    Basic Metabolic Panel: Recent Labs  Lab 02/13/2020 1315 01/29/2020 1315 02/12/20 0623 02/12/20 0623 02/13/20 0500 02/13/20 1132 02/13/20 1523 02/14/20 0157 02/15/20 0500  NA 138   < > 143   < > 143 140 140 142 144  K 3.7   < > 4.4   < > 4.1 3.8 3.9 4.3 4.6  CL 100  --  102  --  100  --   --  102 103  CO2 24  --  29  --  28  --   --  28 29  GLUCOSE 226*  --  182*  --  174*  --   --  178* 171*  BUN 11  --  21*  --  26*  --   --  21* 20  CREATININE 0.83  --  0.90  --  0.86  --   --  0.72 0.78  CALCIUM 6.3*  --  6.3*  --  6.7*  --   --  6.3* 6.7*  MG  --   --   --   --   --   --   --   --  2.3   < > = values in this interval not displayed.   GFR: Estimated Creatinine  Clearance: 94.3 mL/min (by C-G formula based on SCr of 0.78 mg/dL). Recent Labs  Lab 02/04/2020 1315 02/14/2020 1752 02/13/20 0500 02/14/20 0157 02/15/20 0500  PROCALCITON 0.24  --   --   --   --   WBC 5.3  --  6.1 9.8 6.2  LATICACIDVEN 3.1* 1.9  --   --   --     Liver Function Tests: Recent Labs  Lab 02/22/2020 1315 02/12/20 0623 02/13/20 0500  AST 74* 83* 76*  ALT 44 47* 44  ALKPHOS 72 66 72  BILITOT 0.6 0.6 0.6  PROT 6.8 6.5  6.5  ALBUMIN 2.8* 2.5* 2.5*   No results for input(s): LIPASE, AMYLASE in the last 168 hours. No results for input(s): AMMONIA in the last 168 hours.  ABG    Component Value Date/Time   PHART 7.439 02/13/2020 1523   PCO2ART 49.2 (H) 02/13/2020 1523   PO2ART 71 (L) 02/13/2020 1523   HCO3 33.4 (H) 02/13/2020 1523   TCO2 35 (H) 02/13/2020 1523   O2SAT 94.0 02/13/2020 1523     Coagulation Profile: No results for input(s): INR, PROTIME in the last 168 hours.  Cardiac Enzymes: No results for input(s): CKTOTAL, CKMB, CKMBINDEX, TROPONINI in the last 168 hours.  HbA1C: No results found for: HGBA1C  CBG: Recent Labs  Lab 02/14/20 1122 02/14/20 1624 02/14/20 2100 02/14/20 2345 02/15/20 0710  GLUCAP 194* 151* 164* 146* 182*    Critical care time: 33 minutes     CRITICAL CARE Performed by: Darcella Gasman Rorie Delmore   Total critical care time: 33 minutes  Critical care time was exclusive of separately billable procedures and treating other patients.  Critical care was necessary to treat or prevent imminent or life-threatening deterioration.  Critical care was time spent personally by me on the following activities: development of treatment plan with patient and/or surrogate as well as nursing, discussions with consultants, evaluation of patient's response to treatment, examination of patient, obtaining history from patient or surrogate, ordering and performing treatments and interventions, ordering and review of laboratory studies, ordering and  review of radiographic studies, pulse oximetry and re-evaluation of patient's condition.   Darcella Gasman Myrella Fahs, PA-C [facility]  Pager# [phone], if no answer [phone]

## 2020-02-15 NOTE — Progress Notes (Signed)
Lower extremity venous bilateral study completed.   Please see CV Proc for preliminary results.   Celestial Barnfield, RDMS  

## 2020-02-15 NOTE — Progress Notes (Signed)
ANTICOAGULATION CONSULT NOTE - Follow Up Consult  Pharmacy Consult for Heparin Indication: rule out pulmonary embolus  No Known Allergies  Patient Measurements: Height: 4' 11.02" (149.9 cm) Weight: 114.8 kg (253 lb 1.4 oz) IBW/kg (Calculated) : 43.24 Heparin Dosing Weight: 74 kg  Vital Signs: Temp: 98.2 F (36.8 C) (10/21 0418) Temp Source: Axillary (10/21 0418) BP: 138/98 (10/21 0500) Pulse Rate: 64 (10/21 0500)  Labs: Recent Labs    02/13/20 0500 02/13/20 1132 02/13/20 1523 02/13/20 2230 02/14/20 0157 02/14/20 0158 02/14/20 0900 02/15/20 0500  HGB 14.5   < > 13.9  --  13.6  --   --  13.6  HCT 46.7*   < > 41.0  --  43.0  --   --  43.3  PLT 241  --   --   --  249  --   --  283  HEPARINUNFRC  --   --   --    < >  --  0.85* 0.60 0.60  CREATININE 0.86  --   --   --  0.72  --   --  0.78   < > = values in this interval not displayed.    Estimated Creatinine Clearance: 94.3 mL/min (by C-G formula based on SCr of 0.78 mg/dL).  Assessment: 49 year old female with COVID pneumonia to begin empiric heparin for pulmonary embolus - last dose of Lovenox 10/18. Heparin level is therapeutic at 0.60.  Goal of Therapy:  Heparin level 0.3-0.7 units/ml Monitor platelets by anticoagulation protocol: Yes   Plan:  Continue heparin drip at 1200 units / hr Daily heparin level, CBC  Filbert Schilder  [facility] PharmD Candidate 2022 02/15/2020,6:26 AM

## 2020-02-16 DIAGNOSIS — J80 Acute respiratory distress syndrome: Secondary | ICD-10-CM | POA: Diagnosis not present

## 2020-02-16 DIAGNOSIS — U071 COVID-19: Secondary | ICD-10-CM | POA: Diagnosis not present

## 2020-02-16 LAB — D-DIMER, QUANTITATIVE: D-Dimer, Quant: 3.02 ug/mL-FEU — ABNORMAL HIGH (ref 0.00–0.50)

## 2020-02-16 LAB — BASIC METABOLIC PANEL
Anion gap: 14 (ref 5–15)
BUN: 20 mg/dL (ref 6–20)
CO2: 26 mmol/L (ref 22–32)
Calcium: 7.3 mg/dL — ABNORMAL LOW (ref 8.9–10.3)
Chloride: 103 mmol/L (ref 98–111)
Creatinine, Ser: 0.71 mg/dL (ref 0.44–1.00)
GFR, Estimated: >60 mL/min (ref >60)
Glucose, Bld: 121 mg/dL — ABNORMAL HIGH (ref 70–99)
Potassium: 4.8 mmol/L (ref 3.5–5.1)
Sodium: 143 mmol/L (ref 135–145)

## 2020-02-16 LAB — GLUCOSE, CAPILLARY
Glucose-Capillary: 114 mg/dL — ABNORMAL HIGH (ref 70–99)
Glucose-Capillary: 168 mg/dL — ABNORMAL HIGH (ref 70–99)
Glucose-Capillary: 149 mg/dL — ABNORMAL HIGH (ref 70–99)
Glucose-Capillary: 189 mg/dL — ABNORMAL HIGH (ref 70–99)

## 2020-02-16 LAB — FERRITIN: Ferritin: 467 ng/mL — ABNORMAL HIGH (ref 11–307)

## 2020-02-16 LAB — CULTURE, BLOOD (ROUTINE X 2)
Culture: NO GROWTH
Culture: NO GROWTH
Special Requests: ADEQUATE

## 2020-02-16 LAB — CBC
HCT: 43.1 % (ref 36.0–46.0)
Hemoglobin: 13.6 g/dL (ref 12.0–15.0)
MCH: 29.4 pg (ref 26.0–34.0)
MCHC: 31.6 g/dL (ref 30.0–36.0)
MCV: 93.3 fL (ref 80.0–100.0)
Platelets: 368 10*3/uL (ref 150–400)
RBC: 4.62 MIL/uL (ref 3.87–5.11)
RDW: 14.2 % (ref 11.5–15.5)
WBC: 9 10*3/uL (ref 4.0–10.5)
nRBC: 0 % (ref 0.0–0.2)

## 2020-02-16 LAB — LACTATE DEHYDROGENASE: LDH: 913 U/L — ABNORMAL HIGH (ref 98–192)

## 2020-02-16 LAB — HEPARIN LEVEL (UNFRACTIONATED): Heparin Unfractionated: 0.44 IU/mL (ref 0.30–0.70)

## 2020-02-16 LAB — C-REACTIVE PROTEIN: CRP: 3.4 mg/dL — ABNORMAL HIGH (ref <1.0)

## 2020-02-16 MED ORDER — BISACODYL 5 MG PO TBEC
5.0000 mg | DELAYED_RELEASE_TABLET | Freq: Every day | ORAL | Status: DC | PRN
  Administered 2020-02-16 – 2020-02-17 (×2): 5 mg via ORAL
  Filled 2020-02-16 (×2): qty 1

## 2020-02-16 MED ORDER — DEXTROSE 50 % IV SOLN
INTRAVENOUS | Status: AC
  Filled 2020-02-16: qty 50

## 2020-02-16 NOTE — Progress Notes (Signed)
Brief Progress Note:  Pt. Mother updated in full. All questions asked and answered.  Bevelyn Ngo, MSN, AGACNP-BC [facility] See Amion for personal pager PCCM on call pager [phone] 02/16/2020 2:22 PM

## 2020-02-16 NOTE — Progress Notes (Signed)
NAME:  Brianna Velasquez, MRN:  [id number], DOB:  [birth year], LOS: 5 ADMISSION DATE:  02/22/2020, CONSULTATION DATE: 02/17/2020 REFERRING MD: Dr. Lockie Mola, CHIEF COMPLAINT: Hypoxemic respiratory failure  Brief History   49 year old obese woman, diagnosed with COVID-19 4 days PTA with approximately 1 week of symptoms at presentation.  Found to have hypoxemic respiratory failure, initially on BiPAP, subsequently comfortable but marginal on 30 L/min HF [place] +1.00 mask.   History of present illness   49 year old nonvaccinated morbidly obese woman with a history of tobacco use, hyperlipidemia, depression, hypothyroidism.  She began to experience upper respiratory symptoms approximately 1 week ago - cough with some sputum production, HA, change in taste, mild diarrhea. Multiple other family members ill as well, tested positive.  She underwent COVID-19 testing 4 days ago, positive by her report.  She has developed progressive dyspnea and was found to be cyanotic 10/17.  EMS activated an SPO2 70%, improved to upper 80s on BiPAP in transit.  In the ED transitioned back to 15 L/min high flow nasal cannula +1.00 mask, SPO2 in the high 80s percent, respiratory pattern comfortable.  Dexamethasone initiated, single dose remdesivir given in the ED.  Chest x-ray shows diffuse bilateral base predominant pulmonary infiltrates in an ARDS pattern.  Past Medical History  Renal calculi Hypothyroidism Depression MRSA infection following cesarean section  Significant Hospital Events   Brought to ED on BiPAP, hypoxemic 10/17, admitted ICU level care  Consults:    Procedures:     Significant Diagnostic Tests:  10/19 CXR>>Stable diffuse bilateral lung opacities are noted concerning for multifocal pneumonia. 10/19 Echo>>RV Apical Hyperkinesis relative to the rest of the RV. This can be associated with Pulmonary Embolism, Grade 1 diastolic HF 10/21LE dopplers>>Negative for DVT per Doppler>> Limited  Study     Micro Data:  SARSCoV2 10/17 >> positive 10/17 MRSA PCR >> neg 10/17 BCx 2 >>  Antimicrobials:  Remdesivir x1 10/17 Baricitinib 10/17 >> Dexamethasone 10/17 >>   Interim history/subjective:  Currently on BiPAP as patient remains sleepy. RT plans to try HHF once patient is awake.  Dopplers negative for DVT, still concern for PE Unable to tolerate being off BiPap for more than 20 minutes. Pt. Does not tolerate sitting up for more than 20 minutes. + 381 LDH 913, Ferritin 467 CRP 3.4 WBC of 9 Klonopin seems to be helping with anxiety  Calcium corrects to 8.5 with albumin. Will check ionized 10/23   Objective   Blood pressure 133/79, pulse 66, temperature 98.1 F (36.7 C), temperature source Axillary, resp. rate (!) 25, height 4' 11.02" (1.499 m), weight 112.4 kg, SpO2 96 %.    Vent Mode: PCV;BIPAP FiO2 (%):  [80 %-100 %] 90 % Set Rate:  [18 bmp] 18 bmp PEEP:  [12 cmH20] 12 cmH20   Intake/Output Summary (Last 24 hours) at 02/16/2020 0826 Last data filed at 02/16/2020 0800 Gross per 24 hour  Intake 418.64 ml  Output 700 ml  Net -281.36 ml   Filed Weights   02/14/20 0424 02/15/20 0500 02/16/20 0134  Weight: 114.6 kg 114.8 kg 112.4 kg   General:  Resting in no distress, arousable, non-toxic appearing, sleepy HEENT: NCAT, MM pink/moist, No LAD, No JVD Neuro: MAE x 3, Alert to person and place ( sleepy at present), following commands CV: s1s2 rrr, no m/r/g, SR per tele PULM: on Bipap 18/12 70% fiO2, increased bilateral breath sounds from yesterday without wheezing or rhonchi GI: soft, bsx4 active, Pt on abdomen at present Extremities: warm/dry, 1+ edema ,  No cyanosis, mottling  Skin: no rashes or lesions   Resolved Hospital Problem list     Assessment & Plan:   Acute hypoxemic respiratory failure due to COVID-19 pneumonia and ARDS Suspect underlying OHS, Requiring bipap. Remains high risk for intubation P: -Unable to transition off bipap yesterday,  appears comfortable with improved breath sounds today, turn down FiO2 to 70%, again trial OOB and HFNC today if patient can tolerate -Goal O2 saturations for goal SpO2 > 85% -Monitor for worsening mental status, increasing respiratory distress, remains high risk of intubation -continue proning as able -aggressive pulmonary toilet -continue Baricitinib and Steroids, trend inflammatory markers, Remdesevir deferred  -Remains afebrile without leukocytosis, low suspicion secondary bacterial PNA at present -Albuterol -If oxygen sats <85% on Bipap check ABG - CXR prn   Concern for Pulmonary Embolism Positive McConnell's sign on Echo Dopplers negative for DVT P: -Continue empiric heparin -follow LE dopplers  Hyperglycemia,  exacerbated with steroids>> but < 150  P: -Continue SSI, add Levemir 5 units bid    Hypocalcemia -suspect 2/2 critical illness -Repleted, follow electrolytes Plan  Will check ionized calcium 10/23  Hypothyroidism -Continue synthroid, TSH WNL  Hyperlipidemia  -continue holding home zetia and lipitor  Nutrition Pt on soft diet Unable to tolerate off BiPap for long Plan Try OOB to chair Continue Glucerna nutritional supplements    Best practice:  Diet: start soft diet  Pain/Anxiety/Delirium protocol (if indicated): low dose Klonopin VAP protocol (if indicated): NA DVT prophylaxis: enoxaparin  GI prophylaxis: pepcid Glucose control: SSI  Mobility: progress as tolerated  Code Status: Full  Family Communication: Updated Daughter [name] 10/21 Disposition: ICU  Labs   CBC: Recent Labs  Lab February 25, 2020 1315 2020/02/25 1315 02/13/20 0500 02/13/20 0500 02/13/20 1132 02/13/20 1523 02/14/20 0157 02/15/20 0500 02/16/20 0500  WBC 5.3  --  6.1  --   --   --  9.8 6.2 9.0  NEUTROABS 4.6  --   --   --   --   --   --   --   --   HGB 14.8   < > 14.5   < > 13.9 13.9 13.6 13.6 13.6  HCT 47.1*   < > 46.7*   < > 41.0 41.0 43.0 43.3 43.1  MCV 93.1  --  94.2   --   --   --  93.1 93.9 93.3  PLT 198  --  241  --   --   --  249 283 368   < > = values in this interval not displayed.    Basic Metabolic Panel: Recent Labs  Lab 02/12/20 0623 02/12/20 0623 02/13/20 0500 02/13/20 0500 02/13/20 1132 02/13/20 1523 02/14/20 0157 02/15/20 0500 02/16/20 0500  NA 143   < > 143   < > 140 140 142 144 143  K 4.4   < > 4.1   < > 3.8 3.9 4.3 4.6 4.8  CL 102  --  100  --   --   --  102 103 103  CO2 29  --  28  --   --   --  28 29 26   GLUCOSE 182*  --  174*  --   --   --  178* 171* 121*  BUN 21*  --  26*  --   --   --  21* 20 20  CREATININE 0.90  --  0.86  --   --   --  0.72 0.78 0.71  CALCIUM 6.3*  --  6.7*  --   --   --  6.3* 6.7* 7.3*  MG  --   --   --   --   --   --   --  2.3  --    < > = values in this interval not displayed.   GFR: Estimated Creatinine Clearance: 93.1 mL/min (by C-G formula based on SCr of 0.71 mg/dL). Recent Labs  Lab 02/18/2020 1315 February 18, 2020 1315 02/18/2020 1752 02/13/20 0500 02/14/20 0157 02/15/20 0500 02/16/20 0500  PROCALCITON 0.24  --   --   --   --   --   --   WBC 5.3   < >  --  6.1 9.8 6.2 9.0  LATICACIDVEN 3.1*  --  1.9  --   --   --   --    < > = values in this interval not displayed.    Liver Function Tests: Recent Labs  Lab 18-Feb-2020 1315 02/12/20 0623 02/13/20 0500  AST 74* 83* 76*  ALT 44 47* 44  ALKPHOS 72 66 72  BILITOT 0.6 0.6 0.6  PROT 6.8 6.5 6.5  ALBUMIN 2.8* 2.5* 2.5*   No results for input(s): LIPASE, AMYLASE in the last 168 hours. No results for input(s): AMMONIA in the last 168 hours.  ABG    Component Value Date/Time   PHART 7.439 02/13/2020 1523   PCO2ART 49.2 (H) 02/13/2020 1523   PO2ART 71 (L) 02/13/2020 1523   HCO3 33.4 (H) 02/13/2020 1523   TCO2 35 (H) 02/13/2020 1523   O2SAT 94.0 02/13/2020 1523     Coagulation Profile: No results for input(s): INR, PROTIME in the last 168 hours.  Cardiac Enzymes: No results for input(s): CKTOTAL, CKMB, CKMBINDEX, TROPONINI in the last  168 hours.  HbA1C: No results found for: HGBA1C  CBG: Recent Labs  Lab 02/15/20 0710 02/15/20 1119 02/15/20 1615 02/15/20 2041 02/16/20 0743  GLUCAP 182* 147* 138* 109* 114*    Critical care time:  minutes     CRITICAL CARE Performed by: Bevelyn Ngo   Total critical care time: 35 minutes  Critical care time was exclusive of separately billable procedures and treating other patients.  Critical care was necessary to treat or prevent imminent or life-threatening deterioration.  Critical care was time spent personally by me on the following activities: development of treatment plan with patient and/or surrogate as well as nursing, discussions with consultants, evaluation of patient's response to treatment, examination of patient, obtaining history from patient or surrogate, ordering and performing treatments and interventions, ordering and review of laboratory studies, ordering and review of radiographic studies, pulse oximetry and re-evaluation of patient's condition.   Bevelyn Ngo, MSN, AGACNP-BC [facility] See Amion for personal pager PCCM on call pager [phone] 02/16/2020 8:26 AM

## 2020-02-16 NOTE — Progress Notes (Signed)
ANTICOAGULATION CONSULT NOTE - Follow Up Consult  Pharmacy Consult for Heparin Indication: rule out pulmonary embolus  No Known Allergies  Patient Measurements: Height: 4' 11.02" (149.9 cm) Weight: 112.4 kg (247 lb 12.8 oz) IBW/kg (Calculated) : 43.24 Heparin Dosing Weight: 74 kg  Vital Signs: Temp: 99.6 F (37.6 C) (10/22 0412) Temp Source: Axillary (10/22 0412) BP: 130/85 (10/22 0600) Pulse Rate: 62 (10/22 0600)  Labs: Recent Labs    02/14/20 0157 02/14/20 0157 02/14/20 0158 02/14/20 0900 02/15/20 0500 02/16/20 0500  HGB 13.6   < >  --   --  13.6 13.6  HCT 43.0  --   --   --  43.3 43.1  PLT 249  --   --   --  283 368  HEPARINUNFRC  --   --    < > 0.60 0.60 0.44  CREATININE 0.72  --   --   --  0.78 0.71   < > = values in this interval not displayed.    Estimated Creatinine Clearance: 93.1 mL/min (by C-G formula based on SCr of 0.71 mg/dL).  Assessment: 49 year old female with COVID pneumonia to begin empiric heparin for pulmonary embolus - last dose of Lovenox 10/18. Heparin level is therapeutic at 0.44.  Goal of Therapy:  Heparin level 0.3-0.7 units/ml Monitor platelets by anticoagulation protocol: Yes   Plan:  Continue heparin drip at 1200 units / hr Daily heparin level, CBC  Filbert Schilder  [facility] PharmD Candidate 2022 02/16/2020,7:27 AM

## 2020-02-17 ENCOUNTER — Inpatient Hospital Stay (HOSPITAL_COMMUNITY): Payer: Medicaid Other

## 2020-02-17 DIAGNOSIS — U071 COVID-19: Secondary | ICD-10-CM

## 2020-02-17 DIAGNOSIS — J80 Acute respiratory distress syndrome: Secondary | ICD-10-CM

## 2020-02-17 LAB — POCT I-STAT 7, (LYTES, BLD GAS, ICA,H+H)
Acid-Base Excess: 6 mmol/L — ABNORMAL HIGH (ref 0.0–2.0)
Bicarbonate: 30 mmol/L — ABNORMAL HIGH (ref 20.0–28.0)
Calcium, Ion: 0.97 mmol/L — ABNORMAL LOW (ref 1.15–1.40)
HCT: 41 % (ref 36.0–46.0)
Hemoglobin: 13.9 g/dL (ref 12.0–15.0)
O2 Saturation: 90 %
Patient temperature: 97.8
Potassium: 4.4 mmol/L (ref 3.5–5.1)
Sodium: 138 mmol/L (ref 135–145)
TCO2: 31 mmol/L (ref 22–32)
pCO2 arterial: 38.8 mmHg (ref 32.0–48.0)
pH, Arterial: 7.493 — ABNORMAL HIGH (ref 7.350–7.450)
pO2, Arterial: 53 mmHg — ABNORMAL LOW (ref 83.0–108.0)

## 2020-02-17 LAB — FERRITIN: Ferritin: 394 ng/mL — ABNORMAL HIGH (ref 11–307)

## 2020-02-17 LAB — CBC
HCT: 43.3 % (ref 36.0–46.0)
Hemoglobin: 13.7 g/dL (ref 12.0–15.0)
MCH: 29.6 pg (ref 26.0–34.0)
MCHC: 31.6 g/dL (ref 30.0–36.0)
MCV: 93.5 fL (ref 80.0–100.0)
Platelets: 338 10*3/uL (ref 150–400)
RBC: 4.63 MIL/uL (ref 3.87–5.11)
RDW: 14 % (ref 11.5–15.5)
WBC: 10.3 10*3/uL (ref 4.0–10.5)
nRBC: 0.2 % (ref 0.0–0.2)

## 2020-02-17 LAB — LACTATE DEHYDROGENASE: LDH: 710 U/L — ABNORMAL HIGH (ref 98–192)

## 2020-02-17 LAB — COMPREHENSIVE METABOLIC PANEL
ALT: 46 U/L — ABNORMAL HIGH (ref 0–44)
AST: 44 U/L — ABNORMAL HIGH (ref 15–41)
Albumin: 2.4 g/dL — ABNORMAL LOW (ref 3.5–5.0)
Alkaline Phosphatase: 90 U/L (ref 38–126)
Anion gap: 11 (ref 5–15)
BUN: 19 mg/dL (ref 6–20)
CO2: 28 mmol/L (ref 22–32)
Calcium: 7.3 mg/dL — ABNORMAL LOW (ref 8.9–10.3)
Chloride: 101 mmol/L (ref 98–111)
Creatinine, Ser: 0.71 mg/dL (ref 0.44–1.00)
GFR, Estimated: >60 mL/min (ref >60)
Glucose, Bld: 145 mg/dL — ABNORMAL HIGH (ref 70–99)
Potassium: 4.9 mmol/L (ref 3.5–5.1)
Sodium: 140 mmol/L (ref 135–145)
Total Bilirubin: 0.7 mg/dL (ref 0.3–1.2)
Total Protein: 6.3 g/dL — ABNORMAL LOW (ref 6.5–8.1)

## 2020-02-17 LAB — GLUCOSE, CAPILLARY
Glucose-Capillary: 136 mg/dL — ABNORMAL HIGH (ref 70–99)
Glucose-Capillary: 133 mg/dL — ABNORMAL HIGH (ref 70–99)
Glucose-Capillary: 168 mg/dL — ABNORMAL HIGH (ref 70–99)
Glucose-Capillary: 163 mg/dL — ABNORMAL HIGH (ref 70–99)
Glucose-Capillary: 176 mg/dL — ABNORMAL HIGH (ref 70–99)

## 2020-02-17 LAB — HEPARIN LEVEL (UNFRACTIONATED)
Heparin Unfractionated: 0.28 IU/mL — ABNORMAL LOW (ref 0.30–0.70)
Heparin Unfractionated: 0.68 IU/mL (ref 0.30–0.70)

## 2020-02-17 LAB — D-DIMER, QUANTITATIVE: D-Dimer, Quant: 3.06 ug/mL-FEU — ABNORMAL HIGH (ref 0.00–0.50)

## 2020-02-17 LAB — MAGNESIUM: Magnesium: 2.2 mg/dL (ref 1.7–2.4)

## 2020-02-17 LAB — C-REACTIVE PROTEIN: CRP: 3.1 mg/dL — ABNORMAL HIGH (ref <1.0)

## 2020-02-17 MED ORDER — CALCIUM GLUCONATE-NACL 1-0.675 GM/50ML-% IV SOLN
1.0000 g | Freq: Once | INTRAVENOUS | Status: AC
  Administered 2020-02-17: 1000 mg via INTRAVENOUS
  Filled 2020-02-17: qty 50

## 2020-02-17 NOTE — Progress Notes (Signed)
NAME:  Brianna Velasquez, MRN:  [id number], DOB:  [birth year], LOS: 6 ADMISSION DATE:  02/20/2020, CONSULTATION DATE: 01/27/2020 REFERRING MD: Dr. Lockie Mola, CHIEF COMPLAINT: Hypoxemic respiratory failure  Brief History   49 year old obese woman, diagnosed with COVID-19 4 days PTA with approximately 1 week of symptoms at presentation.  Found to have hypoxemic respiratory failure, initially on BiPAP, subsequently comfortable but marginal on 30 L/min HF [place] +1.00 mask.   Past Medical History  Renal calculi Hypothyroidism Depression MRSA infection following cesarean section  Significant Hospital Events   Brought to ED on BiPAP, hypoxemic 10/17, admitted ICU level care  Consults:    Procedures:     Significant Diagnostic Tests:  10/19 CXR>>Stable diffuse bilateral lung opacities are noted concerning for multifocal pneumonia. 10/19 Echo>>RV Apical Hyperkinesis relative to the rest of the RV. This can be associated with Pulmonary Embolism, Grade 1 diastolic HF 10/21LE dopplers>>Negative for DVT per Doppler>> Limited Study     Micro Data:  SARSCoV2 10/17 >> positive 10/17 MRSA PCR >> neg 10/17 BCx 2 >>  Antimicrobials:  Remdesivir x1 10/17 Baricitinib 10/17 >> Dexamethasone 10/17 >>   Interim history/subjective:  Patient remained BiPAP dependent, as soon as she comes off of BiPAP, she desats to 70s She is a still refusing to be intubated  Objective   Blood pressure 132/82, pulse 65, temperature 97.6 F (36.4 C), temperature source Axillary, resp. rate (!) 27, height 4' 11.02" (1.499 m), weight 109.3 kg, SpO2 (!) 86 %.    Vent Mode: BIPAP FiO2 (%):  [60 %-70 %] 70 % Set Rate:  [18 bmp] 18 bmp PEEP:  [12 cmH20] 12 cmH20 Plateau Pressure:  [21 cmH20-22 cmH20] 22 cmH20   Intake/Output Summary (Last 24 hours) at 02/17/2020 1131 Last data filed at 02/17/2020 1000 Gross per 24 hour  Intake 990.35 ml  Output 600 ml  Net 390.35 ml   Filed Weights   02/15/20 0500 02/16/20  0134 02/17/20 0500  Weight: 114.8 kg 112.4 kg 109.3 kg   General: Middle-aged obese female, currently on BiPAP HEENT: NCAT, MM pink/moist, No LAD, No JVD Neuro: MAE x 3, Alert to person and place ( sleepy at present), following commands CV: s1s2 rrr, no m/r/g, SR per tele PULM: on Bipap 18/12 70% fiO2, coarse bilateral breath sounds, no wheezes or rhonchi GI: soft, bsx4 active, Pt on abdomen at present Extremities: warm/dry, 1+ edema , No cyanosis, mottling  Skin: no rashes or lesions   Resolved Hospital Problem list     Assessment & Plan:   Acute hypoxemic respiratory failure due to ARDS from COVID-19 pneumonia Suspect underlying OHS, Requiring bipap. Remains high risk for intubation  Remain dependent on BiPAP, unable to come off of it Goal O2 saturations for goal SpO2 > 85% Continue to encourage self proning -aggressive pulmonary toilet Continue baricitinib and steroids High risk for endotracheal intubation  Concern for Pulmonary Embolism On bedside echo patient had McConnell sign Dopplers negative for DVT Continue IV heparin infusion empirically, unable to get CT angiogram due to her respiratory status  Steroid-induced hyperglycemia Continue SSI, add Levemir 5 units bid  Hypocalcemia Continue electrolyte replacement and monitor  Hypothyroidism Continue synthroid, TSH WNL  Hyperlipidemia  -continue holding home zetia and lipitor  Nutrition Pt on soft diet Unable to tolerate off BiPap for long Try OOB to chair Continue Glucerna nutritional supplements    Best practice:  Diet: start soft diet  Pain/Anxiety/Delirium protocol (if indicated): low dose Klonopin VAP protocol (if indicated): NA DVT prophylaxis: IV  heparin infusion GI prophylaxis: pepcid Glucose control: SSI  Mobility: progress as tolerated  Code Status: Full  Family Communication: Patient's family was updated Disposition: ICU  Labs   CBC: Recent Labs  Lab 01/31/2020 1315 02/16/2020 1315  02/13/20 0500 02/13/20 1132 02/14/20 0157 02/15/20 0500 02/16/20 0500 02/17/20 0549 02/17/20 0627  WBC 5.3   < > 6.1  --  9.8 6.2 9.0 10.3  --   NEUTROABS 4.6  --   --   --   --   --   --   --   --   HGB 14.8   < > 14.5   < > 13.6 13.6 13.6 13.7 13.9  HCT 47.1*   < > 46.7*   < > 43.0 43.3 43.1 43.3 41.0  MCV 93.1   < > 94.2  --  93.1 93.9 93.3 93.5  --   PLT 198   < > 241  --  249 283 368 338  --    < > = values in this interval not displayed.    Basic Metabolic Panel: Recent Labs  Lab 02/13/20 0500 02/13/20 1132 02/14/20 0157 02/15/20 0500 02/16/20 0500 02/17/20 0549 02/17/20 0627  NA 143   < > 142 144 143 140 138  K 4.1   < > 4.3 4.6 4.8 4.9 4.4  CL 100  --  102 103 103 101  --   CO2 28  --  28 29 26 28   --   GLUCOSE 174*  --  178* 171* 121* 145*  --   BUN 26*  --  21* 20 20 19   --   CREATININE 0.86  --  0.72 0.78 0.71 0.71  --   CALCIUM 6.7*  --  6.3* 6.7* 7.3* 7.3*  --   MG  --   --   --  2.3  --  2.2  --    < > = values in this interval not displayed.   GFR: Estimated Creatinine Clearance: 91.4 mL/min (by C-G formula based on SCr of 0.71 mg/dL). Recent Labs  Lab 02/03/2020 1315 02/18/2020 1752 02/13/20 0500 02/14/20 0157 02/15/20 0500 02/16/20 0500 02/17/20 0549  PROCALCITON 0.24  --   --   --   --   --   --   WBC 5.3  --    < > 9.8 6.2 9.0 10.3  LATICACIDVEN 3.1* 1.9  --   --   --   --   --    < > = values in this interval not displayed.    Liver Function Tests: Recent Labs  Lab 02/15/2020 1315 02/12/20 0623 02/13/20 0500 02/17/20 0549  AST 74* 83* 76* 44*  ALT 44 47* 44 46*  ALKPHOS 72 66 72 90  BILITOT 0.6 0.6 0.6 0.7  PROT 6.8 6.5 6.5 6.3*  ALBUMIN 2.8* 2.5* 2.5* 2.4*   No results for input(s): LIPASE, AMYLASE in the last 168 hours. No results for input(s): AMMONIA in the last 168 hours.  ABG    Component Value Date/Time   PHART 7.493 (H) 02/17/2020 0627   PCO2ART 38.8 02/17/2020 0627   PO2ART 53 (L) 02/17/2020 0627   HCO3 30.0 (H)  02/17/2020 0627   TCO2 31 02/17/2020 0627   O2SAT 90.0 02/17/2020 0627     Coagulation Profile: No results for input(s): INR, PROTIME in the last 168 hours.  Cardiac Enzymes: No results for input(s): CKTOTAL, CKMB, CKMBINDEX, TROPONINI in the last 168 hours.  HbA1C: No results found for:  HGBA1C  CBG: Recent Labs  Lab 02/16/20 0743 02/16/20 1211 02/16/20 1628 02/16/20 2040 02/17/20 0820  GLUCAP 114* 168* 149* 189* 136*    Critical care time:  minutes     Total critical care time: 38 minutes  Performed by: Cheri Fowler   Critical care time was exclusive of separately billable procedures and treating other patients.   Critical care was necessary to treat or prevent imminent or life-threatening deterioration.   Critical care was time spent personally by me on the following activities: development of treatment plan with patient and/or surrogate as well as nursing, discussions with consultants, evaluation of patient's response to treatment, examination of patient, obtaining history from patient or surrogate, ordering and performing treatments and interventions, ordering and review of laboratory studies, ordering and review of radiographic studies, pulse oximetry and re-evaluation of patient's condition.   Cheri Fowler MD Critical care physician [facility] [place] [facility]  Pager: [phone] Mobile: [phone]

## 2020-02-17 NOTE — Progress Notes (Signed)
ANTICOAGULATION CONSULT NOTE - Follow Up Consult  Pharmacy Consult for Heparin Indication: rule out pulmonary embolus  No Known Allergies  Patient Measurements: Height: 4' 11.02" (149.9 cm) Weight: 109.3 kg (240 lb 15.4 oz) IBW/kg (Calculated) : 43.24 Heparin Dosing Weight: 74 kg  Vital Signs: Temp: 98.2 F (36.8 C) (10/23 1622) Temp Source: Axillary (10/23 1622) BP: 128/76 (10/23 1920) Pulse Rate: 72 (10/23 1600)  Labs: Recent Labs    02/15/20 0500 02/15/20 0500 02/16/20 0500 02/16/20 0500 02/17/20 0549 02/17/20 0627 02/17/20 1938  HGB 13.6   < > 13.6   < > 13.7 13.9  --   HCT 43.3   < > 43.1  --  43.3 41.0  --   PLT 283  --  368  --  338  --   --   HEPARINUNFRC 0.60   < > 0.44  --  0.28*  --  0.68  CREATININE 0.78  --  0.71  --  0.71  --   --    < > = values in this interval not displayed.    Estimated Creatinine Clearance: 91.4 mL/min (by C-G formula based on SCr of 0.71 mg/dL).  Assessment: 49 year old female with COVID pneumonia to begin empiric heparin for pulmonary embolus - last dose of Lovenox 10/18.   Heparin level is therapeutic on higher end of goal range at 0.68, on 1300 units/hr. Hgb 13.9, plt 338. No s/sx of bleeding or infusion issues.  D-dimer up slightly at 3.06. Vascular US - negative for DVT but limited study  Goal of Therapy:  Heparin level 0.3-0.7 units/ml Monitor platelets by anticoagulation protocol: Yes   Plan:  Will reduce heparin drip to 1250 units / hr  Daily heparin level, CBC  Sherron Monday, PharmD, BCCCP Clinical Pharmacist  Phone: [phone] 02/17/2020 8:51 PM  Please check AMION for all [facility] Pharmacy phone numbers After 10:00 PM, call Main Pharmacy [phone]

## 2020-02-17 NOTE — Progress Notes (Signed)
ANTICOAGULATION CONSULT NOTE - Follow Up Consult  Pharmacy Consult for Heparin Indication: rule out pulmonary embolus  No Known Allergies  Patient Measurements: Height: 4' 11.02" (149.9 cm) Weight: 109.3 kg (240 lb 15.4 oz) IBW/kg (Calculated) : 43.24 Heparin Dosing Weight: 74 kg  Vital Signs: Temp: 97 F (36.1 C) (10/23 0339) Temp Source: Axillary (10/23 0339) BP: 132/82 (10/23 0600) Pulse Rate: 69 (10/23 0600)  Labs: Recent Labs    02/15/20 0500 02/15/20 0500 02/16/20 0500 02/16/20 0500 02/17/20 0549 02/17/20 0627  HGB 13.6   < > 13.6   < > 13.7 13.9  HCT 43.3   < > 43.1  --  43.3 41.0  PLT 283  --  368  --  338  --   HEPARINUNFRC 0.60  --  0.44  --  0.28*  --   CREATININE 0.78  --  0.71  --  0.71  --    < > = values in this interval not displayed.    Estimated Creatinine Clearance: 91.4 mL/min (by C-G formula based on SCr of 0.71 mg/dL).  Assessment: 49 year old female with COVID pneumonia to begin empiric heparin for pulmonary embolus - last dose of Lovenox 10/18.   Heparin level is slightly low at 0.28 on 1200 units/hr (trending down).  CBC is stable. D-dimer up slightly at 3.06.  Vascular US - negative for DVT but limited study  Goal of Therapy:  Heparin level 0.3-0.7 units/ml Monitor platelets by anticoagulation protocol: Yes   Plan:  Increase heparin drip to 1300 units / hr Recheck in 6 hours Daily heparin level, CBC  Link Snuffer, PharmD, BCPS, BCCCP Clinical Pharmacist Please refer to [facility] for [facility] Pharmacy numbers 02/17/2020,8:23 AM

## 2020-02-18 LAB — HEPARIN LEVEL (UNFRACTIONATED)
Heparin Unfractionated: 0.95 IU/mL — ABNORMAL HIGH (ref 0.30–0.70)
Heparin Unfractionated: 0.87 IU/mL — ABNORMAL HIGH (ref 0.30–0.70)

## 2020-02-18 LAB — CALCIUM, IONIZED: Calcium, Ionized, Serum: 3.8 mg/dL — ABNORMAL LOW (ref 4.5–5.6)

## 2020-02-18 LAB — CBC
HCT: 41.5 % (ref 36.0–46.0)
Hemoglobin: 13.3 g/dL (ref 12.0–15.0)
MCH: 29.8 pg (ref 26.0–34.0)
MCHC: 32 g/dL (ref 30.0–36.0)
MCV: 92.8 fL (ref 80.0–100.0)
Platelets: 403 10*3/uL — ABNORMAL HIGH (ref 150–400)
RBC: 4.47 MIL/uL (ref 3.87–5.11)
RDW: 13.9 % (ref 11.5–15.5)
WBC: 14.3 10*3/uL — ABNORMAL HIGH (ref 4.0–10.5)
nRBC: 0 % (ref 0.0–0.2)

## 2020-02-18 LAB — GLUCOSE, CAPILLARY
Glucose-Capillary: 142 mg/dL — ABNORMAL HIGH (ref 70–99)
Glucose-Capillary: 99 mg/dL (ref 70–99)
Glucose-Capillary: 156 mg/dL — ABNORMAL HIGH (ref 70–99)
Glucose-Capillary: 134 mg/dL — ABNORMAL HIGH (ref 70–99)

## 2020-02-18 MED ORDER — CALCIUM GLUCONATE-NACL 1-0.675 GM/50ML-% IV SOLN
1.0000 g | Freq: Once | INTRAVENOUS | Status: AC
  Administered 2020-02-18: 1000 mg via INTRAVENOUS
  Filled 2020-02-18: qty 50

## 2020-02-18 NOTE — Progress Notes (Signed)
ANTICOAGULATION CONSULT NOTE   Pharmacy Consult for Heparin Indication: possible pulmonary embolus  No Known Allergies  Patient Measurements: Height: 4' 11.02" (149.9 cm) Weight: 109.3 kg (240 lb 15.4 oz) IBW/kg (Calculated) : 43.24 Heparin Dosing Weight: 74 kg  Vital Signs: Temp: 98.3 F (36.8 C) (10/24 1929) Temp Source: Axillary (10/24 1929) BP: 128/81 (10/24 2033) Pulse Rate: 65 (10/24 2033)  Labs: Recent Labs    02/16/20 0500 02/16/20 0500 02/17/20 0549 02/17/20 0549 02/17/20 0627 02/17/20 1938 02/18/20 0419 02/18/20 2019  HGB 13.6   < > 13.7   < > 13.9  --  13.3  --   HCT 43.1   < > 43.3  --  41.0  --  41.5  --   PLT 368  --  338  --   --   --  403*  --   HEPARINUNFRC 0.44   < > 0.28*   < >  --  0.68 0.95* 0.87*  CREATININE 0.71  --  0.71  --   --   --   --   --    < > = values in this interval not displayed.    Estimated Creatinine Clearance: 91.4 mL/min (by C-G formula based on SCr of 0.71 mg/dL).  Assessment: 49 y.o. female with possible PE for heparin.   Heparin level came back elevated at 0.87, on 1100 units/hr. CBC stable this morning. No s/sx of bleeding or infusion issues per nursing.   Goal of Therapy:  Heparin level 0.3-0.7 units/ml Monitor platelets by anticoagulation protocol: Yes   Plan:  Decrease Heparin to 950 units/hr Check heparin level in 8 hours Monitor daily HL, CBC, and for s/sx of bleeding   Sherron Monday, PharmD, BCCCP Clinical Pharmacist  Phone: [phone] 02/18/2020 9:09 PM  Please check AMION for all [facility] Pharmacy phone numbers After 10:00 PM, call Main Pharmacy [phone]

## 2020-02-18 NOTE — Plan of Care (Signed)
Lab unable to obtain ptt for heparin gtt, will send another lab tech to attempt draw.

## 2020-02-18 NOTE — Progress Notes (Signed)
NAME:  Brianna Velasquez, MRN:  [id number], DOB:  03-Mar-1971, LOS: 7 ADMISSION DATE:  02/21/2020, CONSULTATION DATE: 02/16/2020 REFERRING MD: Dr. Lockie Mola, CHIEF COMPLAINT: Hypoxemic respiratory failure  Brief History   49 year old obese woman, diagnosed with COVID-19 4 days PTA with approximately 1 week of symptoms at presentation.  Found to have hypoxemic respiratory failure, initially on BiPAP, subsequently comfortable but marginal on 30 L/min HF [place] +1.00 mask.   Past Medical History  Renal calculi Hypothyroidism Depression MRSA infection following cesarean section  Significant Hospital Events   Brought to ED on BiPAP, hypoxemic 10/17, admitted ICU level care  Consults:    Procedures:     Significant Diagnostic Tests:  10/19 CXR>>Stable diffuse bilateral lung opacities are noted concerning for multifocal pneumonia. 10/19 Echo>>RV Apical Hyperkinesis relative to the rest of the RV. This can be associated with Pulmonary Embolism, Grade 1 diastolic HF 10/21LE dopplers>>Negative for DVT per Doppler>> Limited Study     Micro Data:  SARSCoV2 10/17 >> positive 10/17 MRSA PCR >> neg 10/17 BCx 2 >>  Antimicrobials:  Remdesivir x1 10/17 Baricitinib 10/17 >> Dexamethasone 10/17 >>   Interim history/subjective:  Patient remained BiPAP dependent, with 70% FiO2.  Her oxygen saturation have improved from 70s to 90s now, she is self proning Objective   Blood pressure 130/82, pulse 64, temperature (!) 97.3 F (36.3 C), temperature source Axillary, resp. rate (!) 25, height 4' 11.02" (1.499 m), weight 109.3 kg, SpO2 96 %.    Vent Mode: BIPAP FiO2 (%):  [60 %-70 %] 60 % Set Rate:  [18 bmp] 18 bmp PEEP:  [12 cmH20] 12 cmH20 Plateau Pressure:  [21 cmH20-23 cmH20] 22 cmH20   Intake/Output Summary (Last 24 hours) at 02/18/2020 1146 Last data filed at 02/18/2020 1000 Gross per 24 hour  Intake 367.38 ml  Output 950 ml  Net -582.62 ml   Filed Weights   02/15/20 0500 02/16/20  0134 02/17/20 0500  Weight: 114.8 kg 112.4 kg 109.3 kg   General: Middle-aged obese female, currently on BiPAP, self proning HEENT: NCAT, MM pink/moist, No LAD, No JVD Neuro: MAE x 3, Alert to person and place ( sleepy at present), following commands CV: s1s2 rrr, no m/r/g, SR per tele PULM: on Bipap 18/12 70% fiO2, coarse bilateral breath sounds, no wheezes or rhonchi GI: soft, bsx4 active, Pt on abdomen at present Extremities: warm/dry, 1+ edema , No cyanosis, mottling  Skin: no rashes or lesions   Resolved Hospital Problem list     Assessment & Plan:   Acute hypoxemic respiratory failure due to ARDS from COVID-19 pneumonia Suspect underlying OHS, Requiring bipap. Remains high risk for intubation  Remain dependent on BiPAP, with FiO2 of 70% Her O2 sat has improved from yesterday ranging between 90 to 95% We will slowly come off FiO2 and IPAP/EPAP Goal O2 saturations for goal SpO2 > 85% Continue to encourage self proning Continue baricitinib and steroids High risk for endotracheal intubation  Concern for Pulmonary Embolism Continue IV heparin infusion empirically, unable to get CT angiogram due to her respiratory status  Steroid-induced hyperglycemia Continue SSI, add Levemir 5 units bid  Hypocalcemia Her ionized calcium is still low, will give her 1 g of calcium gluconate  Hypothyroidism Continue synthroid  Hyperlipidemia  -continue holding home zetia and lipitor  Nutrition Pt on soft diet Unable to tolerate off BiPap for long Try OOB to chair Continue Glucerna nutritional supplements    Best practice:  Diet: start soft diet  Pain/Anxiety/Delirium protocol (if indicated): low  dose Klonopin VAP protocol (if indicated): NA DVT prophylaxis: IV heparin infusion GI prophylaxis: pepcid Glucose control: SSI  Mobility: progress as tolerated  Code Status: Full  Family Communication: Patient's family was updated Disposition: ICU  Labs   CBC: Recent Labs  Lab  01/28/2020 1315 02/13/20 0500 02/14/20 0157 02/14/20 0157 02/15/20 0500 02/16/20 0500 02/17/20 0549 02/17/20 0627 02/18/20 0419  WBC 5.3   < > 9.8  --  6.2 9.0 10.3  --  14.3*  NEUTROABS 4.6  --   --   --   --   --   --   --   --   HGB 14.8   < > 13.6   < > 13.6 13.6 13.7 13.9 13.3  HCT 47.1*   < > 43.0   < > 43.3 43.1 43.3 41.0 41.5  MCV 93.1   < > 93.1  --  93.9 93.3 93.5  --  92.8  PLT 198   < > 249  --  283 368 338  --  403*   < > = values in this interval not displayed.    Basic Metabolic Panel: Recent Labs  Lab 02/13/20 0500 02/13/20 1132 02/14/20 0157 02/15/20 0500 02/16/20 0500 02/17/20 0549 02/17/20 0627  NA 143   < > 142 144 143 140 138  K 4.1   < > 4.3 4.6 4.8 4.9 4.4  CL 100  --  102 103 103 101  --   CO2 28  --  28 29 26 28   --   GLUCOSE 174*  --  178* 171* 121* 145*  --   BUN 26*  --  21* 20 20 19   --   CREATININE 0.86  --  0.72 0.78 0.71 0.71  --   CALCIUM 6.7*  --  6.3* 6.7* 7.3* 7.3*  --   MG  --   --   --  2.3  --  2.2  --    < > = values in this interval not displayed.   GFR: Estimated Creatinine Clearance: 91.4 mL/min (by C-G formula based on SCr of 0.71 mg/dL). Recent Labs  Lab 02/24/2020 1315 01/27/2020 1752 02/13/20 0500 02/15/20 0500 02/16/20 0500 02/17/20 0549 02/18/20 0419  PROCALCITON 0.24  --   --   --   --   --   --   WBC 5.3  --    < > 6.2 9.0 10.3 14.3*  LATICACIDVEN 3.1* 1.9  --   --   --   --   --    < > = values in this interval not displayed.    Liver Function Tests: Recent Labs  Lab 01/27/2020 1315 02/12/20 0623 02/13/20 0500 02/17/20 0549  AST 74* 83* 76* 44*  ALT 44 47* 44 46*  ALKPHOS 72 66 72 90  BILITOT 0.6 0.6 0.6 0.7  PROT 6.8 6.5 6.5 6.3*  ALBUMIN 2.8* 2.5* 2.5* 2.4*   No results for input(s): LIPASE, AMYLASE in the last 168 hours. No results for input(s): AMMONIA in the last 168 hours.  ABG    Component Value Date/Time   PHART 7.493 (H) 02/17/2020 0627   PCO2ART 38.8 02/17/2020 0627   PO2ART 53 (L)  02/17/2020 0627   HCO3 30.0 (H) 02/17/2020 0627   TCO2 31 02/17/2020 0627   O2SAT 90.0 02/17/2020 0627     Coagulation Profile: No results for input(s): INR, PROTIME in the last 168 hours.  Cardiac Enzymes: No results for input(s): CKTOTAL, CKMB, CKMBINDEX, TROPONINI in  the last 168 hours.  HbA1C: No results found for: HGBA1C  CBG: Recent Labs  Lab 02/17/20 1230 02/17/20 1628 02/17/20 1951 02/17/20 2224 02/18/20 0802  GLUCAP 133* 168* 163* 176* 142*    Critical care time:  minutes     Total critical care time: 35 minutes  Performed by: Cheri Fowler   Critical care time was exclusive of separately billable procedures and treating other patients.   Critical care was necessary to treat or prevent imminent or life-threatening deterioration.   Critical care was time spent personally by me on the following activities: development of treatment plan with patient and/or surrogate as well as nursing, discussions with consultants, evaluation of patient's response to treatment, examination of patient, obtaining history from patient or surrogate, ordering and performing treatments and interventions, ordering and review of laboratory studies, ordering and review of radiographic studies, pulse oximetry and re-evaluation of patient's condition.   Cheri Fowler MD Critical care physician [facility]  Pager: [phone] Mobile: [phone]

## 2020-02-18 NOTE — Progress Notes (Signed)
ANTICOAGULATION CONSULT NOTE   Pharmacy Consult for Heparin Indication: possible pulmonary embolus  No Known Allergies  Patient Measurements: Height: 4' 11.02" (149.9 cm) Weight: 109.3 kg (240 lb 15.4 oz) IBW/kg (Calculated) : 43.24 Heparin Dosing Weight: 74 kg  Vital Signs: BP: 128/76 (10/23 1920) Pulse Rate: 58 (10/24 0049)  Labs: Recent Labs    02/16/20 0500 02/16/20 0500 02/17/20 0549 02/17/20 0549 02/17/20 0627 02/17/20 1938 02/18/20 0419  HGB 13.6   < > 13.7   < > 13.9  --  13.3  HCT 43.1   < > 43.3  --  41.0  --  41.5  PLT 368  --  338  --   --   --  403*  HEPARINUNFRC 0.44   < > 0.28*  --   --  0.68 0.95*  CREATININE 0.71  --  0.71  --   --   --   --    < > = values in this interval not displayed.    Estimated Creatinine Clearance: 91.4 mL/min (by C-G formula based on SCr of 0.71 mg/dL).  Assessment: 49 y.o. female with possible PE for heparin  Goal of Therapy:  Heparin level 0.3-0.7 units/ml Monitor platelets by anticoagulation protocol: Yes   Plan:  Decrease Heparin 1100 units/hr Check heparin level in 8 hours.   Geannie Risen, PharmD, BCPS  02/18/2020,5:20 AM

## 2020-02-18 NOTE — Plan of Care (Signed)
Called phlebotomy and spoke to [place] about ptt for patient. [place] stated she was aware it needed to be drawn and she would be up to draw the lab.

## 2020-02-19 ENCOUNTER — Inpatient Hospital Stay (HOSPITAL_COMMUNITY): Payer: Medicaid Other

## 2020-02-19 DIAGNOSIS — J9601 Acute respiratory failure with hypoxia: Secondary | ICD-10-CM

## 2020-02-19 LAB — GLUCOSE, CAPILLARY
Glucose-Capillary: 110 mg/dL — ABNORMAL HIGH (ref 70–99)
Glucose-Capillary: 132 mg/dL — ABNORMAL HIGH (ref 70–99)
Glucose-Capillary: 178 mg/dL — ABNORMAL HIGH (ref 70–99)
Glucose-Capillary: 217 mg/dL — ABNORMAL HIGH (ref 70–99)
Glucose-Capillary: 92 mg/dL (ref 70–99)

## 2020-02-19 LAB — HEPARIN LEVEL (UNFRACTIONATED): Heparin Unfractionated: 0.85 IU/mL — ABNORMAL HIGH (ref 0.30–0.70)

## 2020-02-19 LAB — MAGNESIUM: Magnesium: 2.2 mg/dL (ref 1.7–2.4)

## 2020-02-19 LAB — PHOSPHORUS: Phosphorus: 4.2 mg/dL (ref 2.5–4.6)

## 2020-02-19 MED ORDER — APIXABAN 5 MG PO TABS
10.0000 mg | ORAL_TABLET | Freq: Two times a day (BID) | ORAL | Status: DC
  Administered 2020-02-19 – 2020-02-23 (×9): 10 mg via ORAL
  Filled 2020-02-19 (×9): qty 2

## 2020-02-19 MED ORDER — VITAL 1.5 CAL PO LIQD
1000.0000 mL | ORAL | Status: DC
  Administered 2020-02-19: 1000 mL
  Filled 2020-02-19: qty 1000

## 2020-02-19 MED ORDER — PROSOURCE TF PO LIQD
45.0000 mL | Freq: Three times a day (TID) | ORAL | Status: DC
  Administered 2020-02-19 – 2020-02-21 (×8): 45 mL
  Filled 2020-02-19 (×6): qty 45

## 2020-02-19 MED ORDER — INSULIN ASPART 100 UNIT/ML ~~LOC~~ SOLN
0.0000 [IU] | SUBCUTANEOUS | Status: DC
  Administered 2020-02-19 – 2020-02-20 (×2): 7 [IU] via SUBCUTANEOUS
  Administered 2020-02-20 (×2): 4 [IU] via SUBCUTANEOUS
  Administered 2020-02-20: 3 [IU] via SUBCUTANEOUS
  Administered 2020-02-21 (×2): 4 [IU] via SUBCUTANEOUS
  Administered 2020-02-21: 3 [IU] via SUBCUTANEOUS
  Administered 2020-02-22 (×4): 4 [IU] via SUBCUTANEOUS
  Administered 2020-02-22: 7 [IU] via SUBCUTANEOUS
  Administered 2020-02-22 – 2020-02-23 (×5): 4 [IU] via SUBCUTANEOUS
  Administered 2020-02-23: 7 [IU] via SUBCUTANEOUS
  Administered 2020-02-23: 4 [IU] via SUBCUTANEOUS
  Administered 2020-02-24: 7 [IU] via SUBCUTANEOUS
  Administered 2020-02-24: 3 [IU] via SUBCUTANEOUS
  Administered 2020-02-24: 11 [IU] via SUBCUTANEOUS
  Administered 2020-02-24: 3 [IU] via SUBCUTANEOUS
  Administered 2020-02-24: 4 [IU] via SUBCUTANEOUS
  Administered 2020-02-24 – 2020-02-25 (×2): 11 [IU] via SUBCUTANEOUS
  Administered 2020-02-25 (×3): 7 [IU] via SUBCUTANEOUS
  Administered 2020-02-25: 11 [IU] via SUBCUTANEOUS
  Administered 2020-02-25 – 2020-02-26 (×2): 7 [IU] via SUBCUTANEOUS
  Administered 2020-02-26 (×2): 11 [IU] via SUBCUTANEOUS

## 2020-02-19 MED ORDER — CALCIUM GLUCONATE-NACL 1-0.675 GM/50ML-% IV SOLN
1.0000 g | Freq: Once | INTRAVENOUS | Status: AC
  Administered 2020-02-19: 1000 mg via INTRAVENOUS
  Filled 2020-02-19: qty 50

## 2020-02-19 MED ORDER — APIXABAN 5 MG PO TABS: 5.0000 mg | ORAL_TABLET | Freq: Two times a day (BID) | ORAL | Status: DC

## 2020-02-19 NOTE — Procedures (Signed)
Cortrak  Tube Type:  Cortrak - 43 inches Tube Location:  Right nare Initial Placement:  Stomach Secured by: Bridle Technique Used to Measure Tube Placement:  Documented cm marking at nare/ corner of mouth Cortrak Secured At:  70 cm    Cortrak Tube Team Note:  Consult received to place a Cortrak feeding tube.   X-ray is required, abdominal x-ray has been ordered by the Cortrak team. Please confirm tube placement before using the Cortrak tube.   If the tube becomes dislodged please keep the tube and contact the Cortrak team at [email or web address] (password [email or web address]) for replacement.  If after hours and replacement cannot be delayed, place a NG tube and confirm placement with an abdominal x-ray.    Betsey Holiday MS, RD, LDN Please refer to [facility] for RD and/or RD on-call/weekend/after hours pager

## 2020-02-19 NOTE — Progress Notes (Signed)
eLink Physician-Brief Progress Note Patient Name: Brianna Velasquez DOB: April 07, 1971 MRN: [id number]   Date of Service  02/19/2020  HPI/Events of Note  Patient on tube feeds with AC/HS resistant Novolog SSI coverage.   eICU Interventions  Plan: 1. Change AC/HS resistant Novolog SSI to Q 4 hour resistant Novolog SSI.     Intervention Category Major Interventions: Hyperglycemia - active titration of insulin therapy  Lenell Antu 02/19/2020, 8:42 PM

## 2020-02-19 NOTE — Progress Notes (Signed)
ANTICOAGULATION CONSULT NOTE   Pharmacy Consult for Heparin Indication: possible pulmonary embolus  No Known Allergies  Patient Measurements: Height: 4' 11.02" (149.9 cm) Weight: 109.5 kg (241 lb 6.5 oz) IBW/kg (Calculated) : 43.24 Heparin Dosing Weight: 74 kg  Vital Signs: Temp: 97.5 F (36.4 C) (10/25 0700) Temp Source: Axillary (10/25 0700) BP: 152/82 (10/25 0804) Pulse Rate: 59 (10/25 0700)  Labs: Recent Labs    02/17/20 0549 02/17/20 0549 02/17/20 0627 02/17/20 1938 02/18/20 0419 02/18/20 2019 02/19/20 0633  HGB 13.7   < > 13.9  --  13.3  --   --   HCT 43.3  --  41.0  --  41.5  --   --   PLT 338  --   --   --  403*  --   --   HEPARINUNFRC 0.28*  --   --    < > 0.95* 0.87* 0.85*  CREATININE 0.71  --   --   --   --   --   --    < > = values in this interval not displayed.    Estimated Creatinine Clearance: 91.5 mL/min (by C-G formula based on SCr of 0.71 mg/dL).  Assessment: 49 y.o. female with possible PE for heparin.   Heparin level came back supratherapeutic at 0.85, on 950 units/hr. Last CBC on 10/24 was stable. No s/sx of bleeding per nursing.   Goal of Therapy:  Heparin level 0.3-0.7 units/ml Monitor platelets by anticoagulation protocol: Yes   Plan:  Decrease Heparin to 800 units/hr Check heparin level in 8 hours Monitor daily HL, CBC, and for s/sx of bleeding   Filbert Schilder  [facility] PharmD Candidate 2022 02/19/2020 8:08 AM

## 2020-02-19 NOTE — Progress Notes (Signed)
Initial Nutrition Assessment  DOCUMENTATION CODES:   Morbid obesity  INTERVENTION:   Initiate tube feeding via Cortrak tube (gastric): Vital 1.5 at 35 ml/h and increase by 10 ml every 8 hours to goal rate of 55 ml/h (1320 ml per day) Prosource TF 45 ml TID  Provides 2100 kcal, 122 gm protein, 1003 ml free water daily    NUTRITION DIAGNOSIS:   Increased nutrient needs related to catabolic illness (COVID+ PNA) as evidenced by estimated needs.  GOAL:   Patient will meet greater than or equal to 90% of their needs  MONITOR:   TF tolerance, I & O's  REASON FOR ASSESSMENT:   Consult Enteral/tube feeding initiation and management  ASSESSMENT:   Pt with PMH of depression, renal calculi, and hypothyroidism is admitted with COVID-19 PNA after one week of symptoms.   Spoke with pt who is currently on BiPAP. Spoke with nursing staff on unit. Per staff pt is unable to be off BiPAP often/long enough to get in nutrition. Spoke with MD, plan for cortrak placement today for nutrition support.   Per MD pt is at high risk of intubation.  Pt unable to tolerate HFNC and put back on BiPAP with 80% FIO2.   Medications reviewed and include: decadron, SSI, levemir, senokot-s Labs reviewed:  CBG's: 134-110-92    NUTRITION - FOCUSED PHYSICAL EXAM:    Most Recent Value  Orbital Region No depletion  Upper Arm Region No depletion  Thoracic and Lumbar Region No depletion  Buccal Region No depletion  Temple Region No depletion  Clavicle Bone Region No depletion  Clavicle and Acromion Bone Region No depletion  Scapular Bone Region No depletion  Dorsal Hand No depletion  Patellar Region No depletion  Anterior Thigh Region No depletion  Posterior Calf Region No depletion  Edema (RD Assessment) Mild  Hair Reviewed  Eyes Reviewed  Mouth Unable to assess  Skin Reviewed  Nails Reviewed       Diet Order:   Diet Order            DIET SOFT Room service appropriate? Yes; Fluid  consistency: Thin  Diet effective now                 EDUCATION NEEDS:   No education needs have been identified at this time  Skin:  Skin Assessment: Reviewed RN Assessment  Last BM:  10/23  Height:   Ht Readings from Last 1 Encounters:  February 26, 2020 4' 11.02" (1.499 m)    Weight:   Wt Readings from Last 1 Encounters:  02/19/20 109.5 kg    Ideal Body Weight:  44.6 kg  BMI:  Body mass index is 48.73 kg/m.  Estimated Nutritional Needs:   Kcal:  1850-2200  Protein:  110-140 grams  Fluid:  2 L/day  Cammy Copa., RD, LDN, CNSC See AMiON for contact information

## 2020-02-19 NOTE — Progress Notes (Signed)
Pt was desating with HHFNC. Placed pt back onto BIPAP. Pt is tolerating at this time. RT will monitor.

## 2020-02-19 NOTE — Progress Notes (Signed)
Pt was taken off of BIPAP and placed on HHFNC 80%/45L. Pt is tolerating at this time with saturations in the mid to high 90s. Will titrate FI02 down as pt tolerates. Pt is stable and comfortable at this time. RT will monitor.

## 2020-02-19 NOTE — Progress Notes (Signed)
NAME:  Brianna Velasquez, MRN:  [id number], DOB:  [birth year], LOS: 8 ADMISSION DATE:  2020/02/21, CONSULTATION DATE: Feb 21, 2020 REFERRING MD: Dr. Lockie Mola, CHIEF COMPLAINT: Hypoxemic respiratory failure  Brief History   49 year old obese woman, diagnosed with COVID-19 4 days PTA with approximately 1 week of symptoms at presentation.  Found to have hypoxemic respiratory failure, initially on BiPAP, subsequently comfortable but marginal on 30 L/min HF [place] +1.00 mask.   Past Medical History  Renal calculi Hypothyroidism Depression MRSA infection following cesarean section  Significant Hospital Events   Brought to ED on BiPAP, hypoxemic 10/17, admitted ICU level care  Consults:    Procedures:     Significant Diagnostic Tests:  10/19 CXR>>Stable diffuse bilateral lung opacities are noted concerning for multifocal pneumonia. 10/19 Echo>>RV Apical Hyperkinesis relative to the rest of the RV. This can be associated with Pulmonary Embolism, Grade 1 diastolic HF 10/21LE dopplers>>Negative for DVT per Doppler>> Limited Study     Micro Data:  SARSCoV2 10/17 >> positive 10/17 MRSA PCR >> neg 10/17 BCx 2 >>  Antimicrobials:  Remdesivir x1 10/17 Baricitinib 10/17 >> Dexamethasone 10/17 >>   Interim history/subjective:  Patient's oxygen saturation remained in mid 90s, with FiO2 60% on BiPAP. This morning she was switched from BiPAP to high flow nasal cannula so that she can eat, has been tolerating well so far. Objective   Blood pressure 138/78, pulse 61, temperature (!) 97.5 F (36.4 C), temperature source Axillary, resp. rate (!) 23, height 4' 11.02" (1.499 m), weight 109.5 kg, SpO2 (!) 88 %.    Vent Mode: BIPAP FiO2 (%):  [50 %-80 %] 80 % Set Rate:  [18 bmp] 18 bmp PEEP:  [12 cmH20] 12 cmH20 Plateau Pressure:  [19 cmH20-22 cmH20] 22 cmH20   Intake/Output Summary (Last 24 hours) at 02/19/2020 1024 Last data filed at 02/19/2020 0900 Gross per 24 hour  Intake 478.92 ml    Output 1200 ml  Net -721.08 ml   Filed Weights   02/16/20 0134 02/17/20 0500 02/19/20 0407  Weight: 112.4 kg 109.3 kg 109.5 kg   General: Middle-aged obese female, currently on high flow nasal cannula HEENT: NCAT, MM pink/moist, No LAD, No JVD Neuro: MAE x 3, Alert to person and place ( sleepy at present), following commands CV: s1s2 rrr, no m/r/g, SR per tele PULM: Coarse bilateral breath sounds, no wheezes or rhonchi GI: soft, bsx4 active, Pt on abdomen at present Extremities: warm/dry, 1+ edema , No cyanosis, mottling  Skin: no rashes or lesions   Resolved Hospital Problem list     Assessment & Plan:   Acute hypoxemic respiratory failure due to ARDS from COVID-19 pneumonia Suspect underlying OHS, Requiring bipap. Remains high risk for intubation  Patient was taken off of BiPAP to high flow nasal cannula oxygen. We will alternate between BiPAP and high flow nasal cannula today Goal O2 saturations for goal SpO2 > 85% Continue to encourage self proning Continue baricitinib and steroids  Concern for Pulmonary Embolism Continue IV heparin infusion empirically, unable to get CT angiogram due to her respiratory status  Steroid-induced hyperglycemia Patient's blood sugars are better controlled now Continue SSI, add Levemir 5 units bid  Hypocalcemia Her ionized calcium is still low, again will give her 1 g of calcium gluconate today  Hypothyroidism Continue synthroid  Hyperlipidemia  -continue holding home zetia and lipitor  Nutrition Pt on soft diet Unable to tolerate off BiPap for long Try OOB to chair Continue Glucerna nutritional supplements    Best practice:  Diet: Soft diet Pain/Anxiety/Delirium protocol (if indicated): low dose Klonopin VAP protocol (if indicated): NA DVT prophylaxis: IV heparin infusion GI prophylaxis: pepcid Glucose control: SSI  Mobility: progress as tolerated  Code Status: Full  Family Communication: Patient's family was  updated Disposition: ICU  Labs   CBC: Recent Labs  Lab 02/14/20 0157 02/14/20 0157 02/15/20 0500 02/16/20 0500 02/17/20 0549 02/17/20 0627 02/18/20 0419  WBC 9.8  --  6.2 9.0 10.3  --  14.3*  HGB 13.6   < > 13.6 13.6 13.7 13.9 13.3  HCT 43.0   < > 43.3 43.1 43.3 41.0 41.5  MCV 93.1  --  93.9 93.3 93.5  --  92.8  PLT 249  --  283 368 338  --  403*   < > = values in this interval not displayed.    Basic Metabolic Panel: Recent Labs  Lab 02/13/20 0500 02/13/20 1132 02/14/20 0157 02/15/20 0500 02/16/20 0500 02/17/20 0549 02/17/20 0627  NA 143   < > 142 144 143 140 138  K 4.1   < > 4.3 4.6 4.8 4.9 4.4  CL 100  --  102 103 103 101  --   CO2 28  --  28 29 26 28   --   GLUCOSE 174*  --  178* 171* 121* 145*  --   BUN 26*  --  21* 20 20 19   --   CREATININE 0.86  --  0.72 0.78 0.71 0.71  --   CALCIUM 6.7*  --  6.3* 6.7* 7.3* 7.3*  --   MG  --   --   --  2.3  --  2.2  --    < > = values in this interval not displayed.   GFR: Estimated Creatinine Clearance: 91.5 mL/min (by C-G formula based on SCr of 0.71 mg/dL). Recent Labs  Lab 02/15/20 0500 02/16/20 0500 02/17/20 0549 02/18/20 0419  WBC 6.2 9.0 10.3 14.3*    Liver Function Tests: Recent Labs  Lab 02/13/20 0500 02/17/20 0549  AST 76* 44*  ALT 44 46*  ALKPHOS 72 90  BILITOT 0.6 0.7  PROT 6.5 6.3*  ALBUMIN 2.5* 2.4*   No results for input(s): LIPASE, AMYLASE in the last 168 hours. No results for input(s): AMMONIA in the last 168 hours.  ABG    Component Value Date/Time   PHART 7.493 (H) 02/17/2020 0627   PCO2ART 38.8 02/17/2020 0627   PO2ART 53 (L) 02/17/2020 0627   HCO3 30.0 (H) 02/17/2020 0627   TCO2 31 02/17/2020 0627   O2SAT 90.0 02/17/2020 0627     Coagulation Profile: No results for input(s): INR, PROTIME in the last 168 hours.  Cardiac Enzymes: No results for input(s): CKTOTAL, CKMB, CKMBINDEX, TROPONINI in the last 168 hours.  HbA1C: No results found for: HGBA1C  CBG: Recent Labs   Lab 02/18/20 0802 02/18/20 1152 02/18/20 1554 02/18/20 2108 02/19/20 0735  GLUCAP 142* 99 156* 134* 110*    Critical care time:  minutes     Total critical care time: 32 minutes  Performed by: [id number]   Critical care time was exclusive of separately billable procedures and treating other patients.   Critical care was necessary to treat or prevent imminent or life-threatening deterioration.   Critical care was time spent personally by me on the following activities: development of treatment plan with patient and/or surrogate as well as nursing, discussions with consultants, evaluation of patient's response to treatment, examination of patient, obtaining history from patient or  surrogate, ordering and performing treatments and interventions, ordering and review of laboratory studies, ordering and review of radiographic studies, pulse oximetry and re-evaluation of patient's condition.   Cheri Fowler MD Critical care physician [facility]  Critical Care  Pager: [phone] Mobile: [phone]

## 2020-02-20 LAB — GLUCOSE, CAPILLARY
Glucose-Capillary: 111 mg/dL — ABNORMAL HIGH (ref 70–99)
Glucose-Capillary: 112 mg/dL — ABNORMAL HIGH (ref 70–99)
Glucose-Capillary: 132 mg/dL — ABNORMAL HIGH (ref 70–99)
Glucose-Capillary: 142 mg/dL — ABNORMAL HIGH (ref 70–99)
Glucose-Capillary: 190 mg/dL — ABNORMAL HIGH (ref 70–99)
Glucose-Capillary: 230 mg/dL — ABNORMAL HIGH (ref 70–99)

## 2020-02-20 LAB — MAGNESIUM
Magnesium: 2.2 mg/dL (ref 1.7–2.4)
Magnesium: 2.1 mg/dL (ref 1.7–2.4)

## 2020-02-20 LAB — PHOSPHORUS
Phosphorus: 4.2 mg/dL (ref 2.5–4.6)
Phosphorus: 3.8 mg/dL (ref 2.5–4.6)

## 2020-02-20 LAB — HEMOGLOBIN A1C
Hgb A1c MFr Bld: 7.4 % — ABNORMAL HIGH (ref 4.8–5.6)
Mean Plasma Glucose: 166 mg/dL

## 2020-02-20 MED ORDER — LACTULOSE 10 GM/15ML PO SOLN
20.0000 g | Freq: Three times a day (TID) | ORAL | Status: DC
  Administered 2020-02-20 (×3): 20 g via ORAL
  Filled 2020-02-20 (×3): qty 30

## 2020-02-20 NOTE — Progress Notes (Signed)
RT and RN suggested to the pt that she should try the [facility] and take a break from the BIPAP. Pt is self proned and is comfortable this time. She doesn't want to try HHFNC even after we both talked to her about the importance of trying it. Rt will attempt at a later time.

## 2020-02-20 NOTE — Progress Notes (Signed)
NAME:  Brianna Velasquez, MRN:  [id number], DOB:  1970/08/20, LOS: 9 ADMISSION DATE:  08-Mar-2020, CONSULTATION DATE: 03-08-2020 REFERRING MD: Dr. Lockie Mola, CHIEF COMPLAINT: Hypoxemic respiratory failure  Brief History   49 year old obese woman, diagnosed with COVID-19 4 days PTA with approximately 1 week of symptoms at presentation.  Found to have hypoxemic respiratory failure, initially on BiPAP, subsequently comfortable but marginal on 30 L/min HF [place] +1.00 mask.   Past Medical History  Renal calculi Hypothyroidism Depression MRSA infection following cesarean section  Significant Hospital Events   Brought to ED on BiPAP, hypoxemic 10/17, admitted ICU level care  Consults:    Procedures:    Significant Diagnostic Tests:  10/19 CXR>>Stable diffuse bilateral lung opacities are noted concerning for multifocal pneumonia. 10/19 Echo>>RV Apical Hyperkinesis relative to the rest of the RV. This can be associated with Pulmonary Embolism, Grade 1 diastolic HF 10/21LE dopplers>>Negative for DVT per Doppler>> Limited Study  Micro Data:  SARSCoV2 10/17 >> positive 10/17 MRSA PCR >> neg 10/17 BCx 2 >> negative  Antimicrobials:  Remdesivir x1 10/17 Baricitinib 10/17 >> Dexamethasone 10/17 >>   Interim history/subjective:  Yesterday patient was taken off of BiPAP for 3 hours after that she became tachypneic and hypoxic again so was put back on BiPAP She has not been eating well because of being on BiPAP for most of the time, cotrak was placed yesterday and tube feeds started Objective   Blood pressure 132/81, pulse (!) 57, temperature (!) 97.2 F (36.2 C), temperature source Axillary, resp. rate 20, height 4' 11.02" (1.499 m), weight 109.5 kg, SpO2 93 %.    Vent Mode: BIPAP FiO2 (%):  [70 %-80 %] 70 % Set Rate:  [18 bmp] 18 bmp   Intake/Output Summary (Last 24 hours) at 02/20/2020 1000 Last data filed at 02/20/2020 0600 Gross per 24 hour  Intake 248.33 ml  Output 300 ml  Net  -51.67 ml   Filed Weights   02/17/20 0500 02/19/20 0407 02/20/20 0410  Weight: 109.3 kg 109.5 kg 109.5 kg   General: Middle-aged obese female, currently on BiPAP HEENT: NCAT, MM pink/moist, No LAD, No JVD Neuro: MAE x 3, Alert to person and place ( sleepy at present), following commands CV: s1s2 rrr, no m/r/g, SR per tele PULM: Coarse bilateral breath sounds, no wheezes or rhonchi GI: soft, bsx4 active, Pt on abdomen at present Extremities: warm/dry, 1+ edema , No cyanosis, mottling  Skin: no rashes or lesions   Resolved Hospital Problem list     Assessment & Plan:   Acute hypoxemic respiratory failure due to ARDS from COVID-19 pneumonia Suspect underlying OHS, Requiring bipap. Remains high risk for intubation  We will try again to take her off BiPAP for a few hours and switch back to nasal cannula We will alternate between BiPAP and high flow nasal cannula today Goal O2 saturations for goal SpO2 > 85% Continue to encourage self proning Continue baricitinib and steroids  Concern for Pulmonary Embolism Continue IV heparin infusion empirically, unable to get CT angiogram due to her respiratory status  Steroid-induced hyperglycemia Patient's blood sugars are better controlled now Now she is started on tube feed Continue SSI, add Levemir 5 units bid  Hypocalcemia Continue supplement electrolytes  Hypothyroidism Continue synthroid  Hyperlipidemia  -continue holding home zetia and lipitor  Nutrition Tube feeds, advance as tolerated Try OOB to chair Continue Glucerna nutritional supplements    Best practice:  Diet: Tube feeds Pain/Anxiety/Delirium protocol (if indicated): low dose Klonopin VAP protocol (if indicated):  NA DVT prophylaxis: IV heparin infusion GI prophylaxis: pepcid Glucose control: SSI  Mobility: progress as tolerated  Code Status: Full  Family Communication: Patient's family was updated Disposition: ICU  Labs   CBC: Recent Labs  Lab  02/14/20 0157 02/14/20 0157 02/15/20 0500 02/16/20 0500 02/17/20 0549 02/17/20 0627 02/18/20 0419  WBC 9.8  --  6.2 9.0 10.3  --  14.3*  HGB 13.6   < > 13.6 13.6 13.7 13.9 13.3  HCT 43.0   < > 43.3 43.1 43.3 41.0 41.5  MCV 93.1  --  93.9 93.3 93.5  --  92.8  PLT 249  --  283 368 338  --  403*   < > = values in this interval not displayed.    Basic Metabolic Panel: Recent Labs  Lab 02/14/20 0157 02/15/20 0500 02/16/20 0500 02/17/20 0549 02/17/20 0627 02/19/20 1652  NA 142 144 143 140 138  --   K 4.3 4.6 4.8 4.9 4.4  --   CL 102 103 103 101  --   --   CO2 28 29 26 28   --   --   GLUCOSE 178* 171* 121* 145*  --   --   BUN 21* 20 20 19   --   --   CREATININE 0.72 0.78 0.71 0.71  --   --   CALCIUM 6.3* 6.7* 7.3* 7.3*  --   --   MG  --  2.3  --  2.2  --  2.2  PHOS  --   --   --   --   --  4.2   GFR: Estimated Creatinine Clearance: 91.5 mL/min (by C-G formula based on SCr of 0.71 mg/dL). Recent Labs  Lab 02/15/20 0500 02/16/20 0500 02/17/20 0549 02/18/20 0419  WBC 6.2 9.0 10.3 14.3*    Liver Function Tests: Recent Labs  Lab 02/17/20 0549  AST 44*  ALT 46*  ALKPHOS 90  BILITOT 0.7  PROT 6.3*  ALBUMIN 2.4*   No results for input(s): LIPASE, AMYLASE in the last 168 hours. No results for input(s): AMMONIA in the last 168 hours.  ABG    Component Value Date/Time   PHART 7.493 (H) 02/17/2020 0627   PCO2ART 38.8 02/17/2020 0627   PO2ART 53 (L) 02/17/2020 0627   HCO3 30.0 (H) 02/17/2020 0627   TCO2 31 02/17/2020 0627   O2SAT 90.0 02/17/2020 0627     Coagulation Profile: No results for input(s): INR, PROTIME in the last 168 hours.  Cardiac Enzymes: No results for input(s): CKTOTAL, CKMB, CKMBINDEX, TROPONINI in the last 168 hours.  HbA1C: No results found for: HGBA1C  CBG: Recent Labs  Lab 02/19/20 1525 02/19/20 1938 02/19/20 2338 02/20/20 0325 02/20/20 0735  GLUCAP 132* 217* 178* 142* 111*    Critical care time:  minutes     Total  critical care time: 32 minutes  Performed by: 02/22/20   Critical care time was exclusive of separately billable procedures and treating other patients.   Critical care was necessary to treat or prevent imminent or life-threatening deterioration.   Critical care was time spent personally by me on the following activities: development of treatment plan with patient and/or surrogate as well as nursing, discussions with consultants, evaluation of patient's response to treatment, examination of patient, obtaining history from patient or surrogate, ordering and performing treatments and interventions, ordering and review of laboratory studies, ordering and review of radiographic studies, pulse oximetry and re-evaluation of patient's condition.   02/22/20 MD Critical  care physician [facility]  Pager: [phone] Mobile: [phone]

## 2020-02-21 LAB — GLUCOSE, CAPILLARY
Glucose-Capillary: 118 mg/dL — ABNORMAL HIGH (ref 70–99)
Glucose-Capillary: 114 mg/dL — ABNORMAL HIGH (ref 70–99)
Glucose-Capillary: 116 mg/dL — ABNORMAL HIGH (ref 70–99)
Glucose-Capillary: 161 mg/dL — ABNORMAL HIGH (ref 70–99)
Glucose-Capillary: 188 mg/dL — ABNORMAL HIGH (ref 70–99)
Glucose-Capillary: 200 mg/dL — ABNORMAL HIGH (ref 70–99)

## 2020-02-21 LAB — BASIC METABOLIC PANEL
Anion gap: 12 (ref 5–15)
BUN: 22 mg/dL — ABNORMAL HIGH (ref 6–20)
CO2: 26 mmol/L (ref 22–32)
Calcium: 8.1 mg/dL — ABNORMAL LOW (ref 8.9–10.3)
Chloride: 102 mmol/L (ref 98–111)
Creatinine, Ser: 0.67 mg/dL (ref 0.44–1.00)
GFR, Estimated: >60 mL/min (ref >60)
Glucose, Bld: 113 mg/dL — ABNORMAL HIGH (ref 70–99)
Potassium: 4.2 mmol/L (ref 3.5–5.1)
Sodium: 140 mmol/L (ref 135–145)

## 2020-02-21 LAB — PHOSPHORUS: Phosphorus: 3.6 mg/dL (ref 2.5–4.6)

## 2020-02-21 LAB — CBC
HCT: 46.1 % — ABNORMAL HIGH (ref 36.0–46.0)
Hemoglobin: 14.8 g/dL (ref 12.0–15.0)
MCH: 29.4 pg (ref 26.0–34.0)
MCHC: 32.1 g/dL (ref 30.0–36.0)
MCV: 91.7 fL (ref 80.0–100.0)
Platelets: 374 10*3/uL (ref 150–400)
RBC: 5.03 MIL/uL (ref 3.87–5.11)
RDW: 13.9 % (ref 11.5–15.5)
WBC: 13.5 10*3/uL — ABNORMAL HIGH (ref 4.0–10.5)
nRBC: 0 % (ref 0.0–0.2)

## 2020-02-21 LAB — MAGNESIUM: Magnesium: 2.2 mg/dL (ref 1.7–2.4)

## 2020-02-21 NOTE — Progress Notes (Signed)
Pt. refused placement of second IV at this time.

## 2020-02-21 NOTE — Progress Notes (Signed)
NAME:  Brianna Velasquez, MRN:  [id number], DOB:  March 22, 1971, LOS: 10 ADMISSION DATE:  02/25/2020, CONSULTATION DATE: 01/27/2020 REFERRING MD: Dr. Lockie Mola, CHIEF COMPLAINT: Hypoxemic respiratory failure  Brief History   49 year old obese woman, diagnosed with COVID-19 4 days PTA with approximately 1 week of symptoms at presentation.  Found to have hypoxemic respiratory failure, initially on BiPAP, subsequently comfortable but marginal on 30 L/min HF [place] +1.00 mask.   Past Medical History  Renal calculi Hypothyroidism Depression MRSA infection following cesarean section  Significant Hospital Events   Brought to ED on BiPAP, hypoxemic 10/17, admitted ICU level care  Consults:    Procedures:    Significant Diagnostic Tests:  10/19 CXR>>Stable diffuse bilateral lung opacities are noted concerning for multifocal pneumonia. 10/19 Echo>>RV Apical Hyperkinesis relative to the rest of the RV. This can be associated with Pulmonary Embolism, Grade 1 diastolic HF 10/21LE dopplers>>Negative for DVT per Doppler>> Limited Study  Micro Data:  SARSCoV2 10/17 >> positive 10/17 MRSA PCR >> neg 10/17 BCx 2 >> negative  Antimicrobials:  Remdesivir x1 10/17 Baricitinib 10/17 >> Dexamethasone 10/17 >>   Interim history/subjective:  Patient refused to come off of BiPAP yesterday, remains on BiPAP throughout, she is able to maintain O2 sat above 90%, very anxious to come off and try high flow nasal cannula Encouraged that she should try high flow nasal cannula today Objective   Blood pressure 134/63, pulse 80, temperature 97.6 F (36.4 C), temperature source Axillary, resp. rate (!) 26, height 4' 11.02" (1.499 m), weight 109.4 kg, SpO2 90 %.    Vent Mode: BIPAP FiO2 (%):  [60 %-70 %] 70 % Set Rate:  [18 bmp] 18 bmp PEEP:  [12 cmH20] 12 cmH20 Plateau Pressure:  [17 cmH20] 17 cmH20   Intake/Output Summary (Last 24 hours) at 02/21/2020 1018 Last data filed at 02/21/2020 0600 Gross per 24  hour  Intake 350 ml  Output 600 ml  Net -250 ml   Filed Weights   02/19/20 0407 02/20/20 0410 02/21/20 0257  Weight: 109.5 kg 109.5 kg 109.4 kg   General: Middle-aged obese female, currently on BiPAP HEENT: NCAT, MM pink/moist, No LAD, No JVD Neuro: MAE x 3, Alert to person and place ( sleepy at present), following commands CV: s1s2 rrr, no m/r/g, SR per tele PULM: Coarse bilateral breath sounds, no wheezes or rhonchi GI: soft, bsx4 active, Pt on abdomen at present Extremities: warm/dry, 1+ edema , No cyanosis, mottling  Skin: no rashes or lesions   Resolved Hospital Problem list     Assessment & Plan:   Acute hypoxemic respiratory failure due to ARDS from COVID-19 pneumonia Suspect underlying OHS, Requiring bipap. Remains high risk for intubation  Today we will definitely try to switch from BiPAP to high flow nasal cannula for at least few hours as she remained dependent on BiPAP throughout Goal O2 saturations for goal SpO2 > 85%, goal has been achieved with 70% FiO2 on BiPAP Continue to encourage self proning Continue baricitinib and steroids  Concern for Pulmonary Embolism Continue IV heparin infusion empirically, unable to get CT angiogram due to her respiratory status  Steroid-induced hyperglycemia Patient's blood sugars are better controlled now Continue tube feeds Continue SSI, add Levemir 5 units bid  Hypocalcemia Continue supplement electrolytes Corrected calcium with low albumin is within normal limits now  Hypothyroidism Continue synthroid  Hyperlipidemia  -continue holding home zetia and lipitor  Nutrition Tube feeds, advance as tolerated Out of bed to chair today Continue Glucerna nutritional supplements  Best practice:  Diet: Tube feeds Pain/Anxiety/Delirium protocol (if indicated): low dose Klonopin VAP protocol (if indicated): NA DVT prophylaxis: IV heparin infusion GI prophylaxis: pepcid Glucose control: SSI  Mobility: progress as  tolerated  Code Status: Full  Family Communication: Patient's family was updated Disposition: ICU  Labs   CBC: Recent Labs  Lab 02/15/20 0500 02/15/20 0500 02/16/20 0500 02/17/20 0549 02/17/20 0627 02/18/20 0419 02/21/20 0704  WBC 6.2  --  9.0 10.3  --  14.3* 13.5*  HGB 13.6   < > 13.6 13.7 13.9 13.3 14.8  HCT 43.3   < > 43.1 43.3 41.0 41.5 46.1*  MCV 93.9  --  93.3 93.5  --  92.8 91.7  PLT 283  --  368 338  --  403* 374   < > = values in this interval not displayed.    Basic Metabolic Panel: Recent Labs  Lab 02/15/20 0500 02/15/20 0500 02/16/20 0500 02/17/20 0549 02/17/20 0627 02/19/20 1652 02/20/20 0935 02/20/20 1644 02/21/20 0704  NA 144  --  143 140 138  --   --   --  140  K 4.6  --  4.8 4.9 4.4  --   --   --  4.2  CL 103  --  103 101  --   --   --   --  102  CO2 29  --  26 28  --   --   --   --  26  GLUCOSE 171*  --  121* 145*  --   --   --   --  113*  BUN 20  --  20 19  --   --   --   --  22*  CREATININE 0.78  --  0.71 0.71  --   --   --   --  0.67  CALCIUM 6.7*  --  7.3* 7.3*  --   --   --   --  8.1*  MG 2.3   < >  --  2.2  --  2.2 2.2 2.1 2.2  PHOS  --   --   --   --   --  4.2 4.2 3.8 3.6   < > = values in this interval not displayed.   GFR: Estimated Creatinine Clearance: 91.5 mL/min (by C-G formula based on SCr of 0.67 mg/dL). Recent Labs  Lab 02/16/20 0500 02/17/20 0549 02/18/20 0419 02/21/20 0704  WBC 9.0 10.3 14.3* 13.5*    Liver Function Tests: Recent Labs  Lab 02/17/20 0549  AST 44*  ALT 46*  ALKPHOS 90  BILITOT 0.7  PROT 6.3*  ALBUMIN 2.4*   No results for input(s): LIPASE, AMYLASE in the last 168 hours. No results for input(s): AMMONIA in the last 168 hours.  ABG    Component Value Date/Time   PHART 7.493 (H) 02/17/2020 0627   PCO2ART 38.8 02/17/2020 0627   PO2ART 53 (L) 02/17/2020 0627   HCO3 30.0 (H) 02/17/2020 0627   TCO2 31 02/17/2020 0627   O2SAT 90.0 02/17/2020 0627     Coagulation Profile: No results for  input(s): INR, PROTIME in the last 168 hours.  Cardiac Enzymes: No results for input(s): CKTOTAL, CKMB, CKMBINDEX, TROPONINI in the last 168 hours.  HbA1C: Hgb A1c MFr Bld  Date/Time Value Ref Range Status  02/19/2020 10:21 PM 7.4 (H) 4.8 - 5.6 % Final    Comment:    (NOTE)         Prediabetes: 5.7 - 6.4  Diabetes: >6.4         Glycemic control for adults with diabetes: <7.0     CBG: Recent Labs  Lab 02/20/20 1639 02/20/20 1940 02/20/20 2351 02/21/20 0328 02/21/20 0824  GLUCAP 190* 230* 132* 118* 114*    Critical care time:  minutes     Total critical care time: 35 minutes  Performed by: Cheri Fowler   Critical care time was exclusive of separately billable procedures and treating other patients.   Critical care was necessary to treat or prevent imminent or life-threatening deterioration.   Critical care was time spent personally by me on the following activities: development of treatment plan with patient and/or surrogate as well as nursing, discussions with consultants, evaluation of patient's response to treatment, examination of patient, obtaining history from patient or surrogate, ordering and performing treatments and interventions, ordering and review of laboratory studies, ordering and review of radiographic studies, pulse oximetry and re-evaluation of patient's condition.   Cheri Fowler MD Critical care physician [facility] [place] [facility]  Pager: [phone] Mobile: [phone]

## 2020-02-21 NOTE — Progress Notes (Signed)
Took pt off bipap and placed on HHFNC at 50L/100%. Pt remains proned but RN will get her up to chair when able. VS within normal limits. Pt denies SOB.

## 2020-02-22 LAB — GLUCOSE, CAPILLARY
Glucose-Capillary: 154 mg/dL — ABNORMAL HIGH (ref 70–99)
Glucose-Capillary: 160 mg/dL — ABNORMAL HIGH (ref 70–99)
Glucose-Capillary: 171 mg/dL — ABNORMAL HIGH (ref 70–99)
Glucose-Capillary: 180 mg/dL — ABNORMAL HIGH (ref 70–99)
Glucose-Capillary: 194 mg/dL — ABNORMAL HIGH (ref 70–99)
Glucose-Capillary: 208 mg/dL — ABNORMAL HIGH (ref 70–99)

## 2020-02-22 MED ORDER — INSULIN DETEMIR 100 UNIT/ML ~~LOC~~ SOLN
8.0000 [IU] | Freq: Two times a day (BID) | SUBCUTANEOUS | Status: DC
  Administered 2020-02-22 – 2020-02-23 (×4): 8 [IU] via SUBCUTANEOUS
  Filled 2020-02-22 (×6): qty 0.08

## 2020-02-22 MED ORDER — VITAL 1.5 CAL PO LIQD
1000.0000 mL | ORAL | Status: DC
  Administered 2020-02-22 – 2020-02-24 (×2): 1000 mL
  Filled 2020-02-22 (×3): qty 1000

## 2020-02-22 MED ORDER — PROSOURCE TF PO LIQD
90.0000 mL | Freq: Two times a day (BID) | ORAL | Status: DC
  Administered 2020-02-22 – 2020-02-26 (×9): 90 mL
  Filled 2020-02-22 (×10): qty 90

## 2020-02-22 NOTE — Progress Notes (Signed)
Pt taken off bipap and placed on HHFNC at 55L/100% plus 15L NRB. Pt flipped supine and is sitting in chair position. Spo2 low but pt in no distress. RT will continue to closely monitor pt

## 2020-02-22 NOTE — Progress Notes (Signed)
Nutrition Follow-up  DOCUMENTATION CODES:   Morbid obesity  INTERVENTION:   Tube feeding via Cortrak tube (gastric): Increase Vital 1.5 to 60 ml/h (1440 ml per day) Prosource TF 90 ml BID  Provides 2320 kcal, 141 gm protein, 1094 ml free water daily   NUTRITION DIAGNOSIS:   Increased nutrient needs related to catabolic illness (COVID+ PNA) as evidenced by estimated needs. Ongoing.   GOAL:   Patient will meet greater than or equal to 90% of their needs Met with TF.   MONITOR:   TF tolerance, I & O's  REASON FOR ASSESSMENT:   Consult Enteral/tube feeding initiation and management  ASSESSMENT:   Pt with PMH of depression, renal calculi, and hypothyroidism is admitted with COVID-19 PNA after one week of symptoms.   10/25 cortrak placed, tip in stomach   Staff working with patient on transitioning from BiPAP to HFNC.  Pt taken off BiPAP this am and placed on 55 L HFNC with 15 L NRB.  Pt has been unable to eat due to high rate of O2 administration.   Medications reviewed and include: SSI, levemir, senokot-s Labs reviewed:  CBG's: [phone]   Current TF:  Vital 1.5 @ 55 ml/h and Prosource TF 45 ml TID Provides 2100 kcal, 122 gm protein  Diet Order:   Diet Order            DIET SOFT Room service appropriate? Yes; Fluid consistency: Thin  Diet effective now                 EDUCATION NEEDS:   No education needs have been identified at this time  Skin:  Skin Assessment: Reviewed RN Assessment  Last BM:  10/27  Height:   Ht Readings from Last 1 Encounters:  02/12/2020 4' 11.02" (1.499 m)    Weight:   Wt Readings from Last 1 Encounters:  02/22/20 108.6 kg    Ideal Body Weight:  44.6 kg  BMI:  Body mass index is 48.33 kg/m.  Estimated Nutritional Needs:   Kcal:  2200-2400  Protein:  110-140 grams  Fluid:  2 L/day  Lockie Pares., RD, LDN, CNSC See AMiON for contact information

## 2020-02-22 NOTE — Progress Notes (Signed)
NAME:  Brianna Velasquez, MRN:  [id number], DOB:  02/11/1971, LOS: 11 ADMISSION DATE:  02/16/20, CONSULTATION DATE: Feb 16, 2020 REFERRING MD: Dr. Lockie Mola, CHIEF COMPLAINT: Hypoxemic respiratory failure  Brief History   49 year old obese woman, diagnosed with COVID-19 4 days PTA with approximately 1 week of symptoms at presentation.  Found to have hypoxemic respiratory failure, initially on BiPAP, subsequently comfortable but marginal on 30 L/min HF [place] +1.00 mask.   Past Medical History  Renal calculi Hypothyroidism Depression MRSA infection following cesarean section  Significant Hospital Events   Brought to ED on BiPAP, hypoxemic 10/17, admitted ICU level care  Consults:    Procedures:    Significant Diagnostic Tests:  10/19 CXR>>Stable diffuse bilateral lung opacities are noted concerning for multifocal pneumonia. 10/19 Echo>>RV Apical Hyperkinesis relative to the rest of the RV. This can be associated with Pulmonary Embolism, Grade 1 diastolic HF 10/21LE dopplers>>Negative for DVT per Doppler>> Limited Study  Micro Data:  SARSCoV2 10/17 >> positive 10/17 MRSA PCR >> neg 10/17 BCx 2 >> negative  Antimicrobials:  Remdesivir x1 10/17 Baricitinib 10/17 >> Dexamethasone 10/17 >>   Interim history/subjective:  Patient was placed on high flow nasal cannula with nonrebreather yesterday, she tolerated it for 6 hours, then was put back on BiPAP, this morning again she was put back on high flow nasal cannula.  She is hypoxic into the 80s but not tachypneic or tachycardic Objective   Blood pressure 126/71, pulse (!) 112, temperature 97.7 F (36.5 C), temperature source Axillary, resp. rate (!) 30, height 4' 11.02" (1.499 m), weight 108.6 kg, SpO2 (!) 72 %.    Vent Mode: BIPAP;PCV FiO2 (%):  [70 %-100 %] 100 % Set Rate:  [18 bmp] 18 bmp PEEP:  [12 cmH20] 12 cmH20   Intake/Output Summary (Last 24 hours) at 02/22/2020 1015 Last data filed at 02/22/2020 0900 Gross per 24 hour   Intake 1445 ml  Output 851 ml  Net 594 ml   Filed Weights   02/20/20 0410 02/21/20 0257 02/22/20 0500  Weight: 109.5 kg 109.4 kg 108.6 kg   General: Middle-aged obese female, currently on high flow nasal cannula HEENT: NCAT, MM pink/moist, No JVD Neuro: MAE x 3, Alert to person and place ( sleepy at present), following commands CV: s1s2 rrr, no m/r/g, SR per tele PULM: Coarse bilateral breath sounds, no wheezes or rhonchi GI: soft, bsx4 active, Pt on abdomen at present Extremities: warm/dry, 1+ edema , No cyanosis, mottling  Skin: no rashes or lesions   Resolved Hospital Problem list   Hypocalcemia  Assessment & Plan:   Acute hypoxemic respiratory failure due to ARDS from COVID-19 pneumonia Suspect underlying OHS, Requiring bipap. Remains high risk for intubation  Continue alternate between BiPAP and high flow nasal cannula, currently on high flow nasal cannula and tolerating well despite hypoxic she is not tachypneic or tachycardic We will hold off BiPAP during daytime as much as possible Goal O2 saturations for goal SpO2 > 85% Continue to encourage self proning Patient completed course of steroid Continue baricitinib  Concern for Pulmonary Embolism Continue IV heparin infusion empirically, unable to get CT angiogram due to her respiratory status  Steroid-induced hyperglycemia Patient is slightly hyperglycemic after she was started on tube feeds Continue tube feeds Continue SSI, increase Levemir to 8 units twice daily  Hypothyroidism Continue synthroid  Hyperlipidemia  -continue holding home zetia and lipitor  Nutrition Tube feeds, advance as tolerated Out of bed to chair today Continue Glucerna nutritional supplements    Best  practice:  Diet: Tube feeds Pain/Anxiety/Delirium protocol (if indicated): low dose Klonopin VAP protocol (if indicated): NA DVT prophylaxis: Apixaban GI prophylaxis: N/A Glucose control: SSI  Mobility: Out of bed to chair Code  Status: Full  Family Communication: Patient's family was updated Disposition: ICU  Labs   CBC: Recent Labs  Lab 02/16/20 0500 02/17/20 0549 02/17/20 0627 02/18/20 0419 02/21/20 0704  WBC 9.0 10.3  --  14.3* 13.5*  HGB 13.6 13.7 13.9 13.3 14.8  HCT 43.1 43.3 41.0 41.5 46.1*  MCV 93.3 93.5  --  92.8 91.7  PLT 368 338  --  403* 374    Basic Metabolic Panel: Recent Labs  Lab 02/16/20 0500 02/17/20 0549 02/17/20 0627 02/19/20 1652 02/20/20 0935 02/20/20 1644 02/21/20 0704  NA 143 140 138  --   --   --  140  K 4.8 4.9 4.4  --   --   --  4.2  CL 103 101  --   --   --   --  102  CO2 26 28  --   --   --   --  26  GLUCOSE 121* 145*  --   --   --   --  113*  BUN 20 19  --   --   --   --  22*  CREATININE 0.71 0.71  --   --   --   --  0.67  CALCIUM 7.3* 7.3*  --   --   --   --  8.1*  MG  --  2.2  --  2.2 2.2 2.1 2.2  PHOS  --   --   --  4.2 4.2 3.8 3.6   GFR: Estimated Creatinine Clearance: 91.1 mL/min (by C-G formula based on SCr of 0.67 mg/dL). Recent Labs  Lab 02/16/20 0500 02/17/20 0549 02/18/20 0419 02/21/20 0704  WBC 9.0 10.3 14.3* 13.5*    Liver Function Tests: Recent Labs  Lab 02/17/20 0549  AST 44*  ALT 46*  ALKPHOS 90  BILITOT 0.7  PROT 6.3*  ALBUMIN 2.4*   No results for input(s): LIPASE, AMYLASE in the last 168 hours. No results for input(s): AMMONIA in the last 168 hours.  ABG    Component Value Date/Time   PHART 7.493 (H) 02/17/2020 0627   PCO2ART 38.8 02/17/2020 0627   PO2ART 53 (L) 02/17/2020 0627   HCO3 30.0 (H) 02/17/2020 0627   TCO2 31 02/17/2020 0627   O2SAT 90.0 02/17/2020 0627     Coagulation Profile: No results for input(s): INR, PROTIME in the last 168 hours.  Cardiac Enzymes: No results for input(s): CKTOTAL, CKMB, CKMBINDEX, TROPONINI in the last 168 hours.  HbA1C: Hgb A1c MFr Bld  Date/Time Value Ref Range Status  02/19/2020 10:21 PM 7.4 (H) 4.8 - 5.6 % Final    Comment:    (NOTE)         Prediabetes: 5.7 - 6.4          Diabetes: >6.4         Glycemic control for adults with diabetes: <7.0     CBG: Recent Labs  Lab 02/21/20 1550 02/21/20 1928 02/21/20 2328 02/22/20 0337 02/22/20 0736  GLUCAP 161* 188* 200* 171* 194*    Critical care time:  minutes     Total critical care time: 32 minutes  Performed by: Cheri Fowler   Critical care time was exclusive of separately billable procedures and treating other patients.   Critical care was necessary to treat or  prevent imminent or life-threatening deterioration.   Critical care was time spent personally by me on the following activities: development of treatment plan with patient and/or surrogate as well as nursing, discussions with consultants, evaluation of patient's response to treatment, examination of patient, obtaining history from patient or surrogate, ordering and performing treatments and interventions, ordering and review of laboratory studies, ordering and review of radiographic studies, pulse oximetry and re-evaluation of patient's condition.   Cheri Fowler MD Critical care physician [facility] [place] [facility]  Pager: [phone] Mobile: [phone]

## 2020-02-23 ENCOUNTER — Inpatient Hospital Stay (HOSPITAL_COMMUNITY): Payer: Medicaid Other

## 2020-02-23 DIAGNOSIS — Z7189 Other specified counseling: Secondary | ICD-10-CM

## 2020-02-23 DIAGNOSIS — J80 Acute respiratory distress syndrome: Secondary | ICD-10-CM | POA: Diagnosis not present

## 2020-02-23 DIAGNOSIS — U071 COVID-19: Secondary | ICD-10-CM | POA: Diagnosis not present

## 2020-02-23 LAB — GLUCOSE, CAPILLARY
Glucose-Capillary: 152 mg/dL — ABNORMAL HIGH (ref 70–99)
Glucose-Capillary: 224 mg/dL — ABNORMAL HIGH (ref 70–99)
Glucose-Capillary: 184 mg/dL — ABNORMAL HIGH (ref 70–99)
Glucose-Capillary: 176 mg/dL — ABNORMAL HIGH (ref 70–99)
Glucose-Capillary: 151 mg/dL — ABNORMAL HIGH (ref 70–99)
Glucose-Capillary: 134 mg/dL — ABNORMAL HIGH (ref 70–99)

## 2020-02-23 LAB — POCT I-STAT 7, (LYTES, BLD GAS, ICA,H+H)
Acid-Base Excess: 3 mmol/L — ABNORMAL HIGH (ref 0.0–2.0)
Bicarbonate: 34.3 mmol/L — ABNORMAL HIGH (ref 20.0–28.0)
Calcium, Ion: 1.19 mmol/L (ref 1.15–1.40)
HCT: 46 % (ref 36.0–46.0)
Hemoglobin: 15.6 g/dL — ABNORMAL HIGH (ref 12.0–15.0)
O2 Saturation: 52 %
Patient temperature: 97.9
Potassium: 5 mmol/L (ref 3.5–5.1)
Sodium: 142 mmol/L (ref 135–145)
TCO2: 37 mmol/L — ABNORMAL HIGH (ref 22–32)
pCO2 arterial: 80.7 mmHg (ref 32.0–48.0)
pH, Arterial: 7.234 — ABNORMAL LOW (ref 7.350–7.450)
pO2, Arterial: 34 mmHg — CL (ref 83.0–108.0)

## 2020-02-23 LAB — TRIGLYCERIDES: Triglycerides: 208 mg/dL — ABNORMAL HIGH (ref <150)

## 2020-02-23 MED ORDER — ETOMIDATE 2 MG/ML IV SOLN
INTRAVENOUS | Status: AC
  Administered 2020-02-23: 20 mg via INTRAVENOUS
  Filled 2020-02-23: qty 10

## 2020-02-23 MED ORDER — ROCURONIUM BROMIDE 50 MG/5ML IV SOLN
100.0000 mg | Freq: Once | INTRAVENOUS | Status: DC
  Filled 2020-02-23: qty 10

## 2020-02-23 MED ORDER — CISATRACURIUM BOLUS VIA INFUSION
0.1000 mg/kg | Freq: Once | INTRAVENOUS | Status: AC
  Administered 2020-02-23: 10.6 mg via INTRAVENOUS
  Filled 2020-02-23: qty 11

## 2020-02-23 MED ORDER — APIXABAN 5 MG PO TABS
5.0000 mg | ORAL_TABLET | Freq: Two times a day (BID) | ORAL | Status: DC
  Administered 2020-02-26: 5 mg
  Filled 2020-02-23: qty 1

## 2020-02-23 MED ORDER — SODIUM CHLORIDE 0.9 % IV SOLN: INTRAVENOUS | Status: DC | PRN

## 2020-02-23 MED ORDER — FENTANYL 2500MCG IN NS 250ML (10MCG/ML) PREMIX INFUSION
0.0000 ug/h | INTRAVENOUS | Status: DC
  Administered 2020-02-23: 150 ug/h via INTRAVENOUS
  Administered 2020-02-24 – 2020-02-26 (×5): 250 ug/h via INTRAVENOUS
  Filled 2020-02-23 (×6): qty 250

## 2020-02-23 MED ORDER — MIDAZOLAM HCL 2 MG/2ML IJ SOLN: 2.0000 mg | INTRAMUSCULAR | Status: DC | PRN

## 2020-02-23 MED ORDER — FENTANYL 2500MCG IN NS 250ML (10MCG/ML) PREMIX INFUSION
0.0000 ug/h | INTRAVENOUS | Status: DC
  Administered 2020-02-23: 25 ug/h via INTRAVENOUS
  Filled 2020-02-23: qty 250

## 2020-02-23 MED ORDER — QUETIAPINE FUMARATE 50 MG PO TABS
50.0000 mg | ORAL_TABLET | Freq: Two times a day (BID) | ORAL | Status: DC
  Administered 2020-02-23: 50 mg via ORAL

## 2020-02-23 MED ORDER — VECURONIUM BROMIDE 10 MG IV SOLR
10.0000 mg | INTRAVENOUS | Status: DC | PRN
  Administered 2020-02-23: 10 mg via INTRAVENOUS
  Filled 2020-02-23 (×2): qty 10

## 2020-02-23 MED ORDER — ARTIFICIAL TEARS OPHTHALMIC OINT
1.0000 "application " | TOPICAL_OINTMENT | Freq: Three times a day (TID) | OPHTHALMIC | Status: DC
  Administered 2020-02-23 – 2020-02-26 (×8): 1 via OPHTHALMIC
  Filled 2020-02-23 (×2): qty 3.5

## 2020-02-23 MED ORDER — CALCIUM GLUCONATE-NACL 2-0.675 GM/100ML-% IV SOLN
2.0000 g | Freq: Once | INTRAVENOUS | Status: DC
  Filled 2020-02-23: qty 100

## 2020-02-23 MED ORDER — ETOMIDATE 2 MG/ML IV SOLN: 20.0000 mg | Freq: Once | INTRAVENOUS | Status: AC

## 2020-02-23 MED ORDER — FUROSEMIDE 10 MG/ML IJ SOLN
20.0000 mg | Freq: Once | INTRAMUSCULAR | Status: AC
  Administered 2020-02-23: 20 mg via INTRAVENOUS
  Filled 2020-02-23: qty 2

## 2020-02-23 MED ORDER — ARTIFICIAL TEARS OPHTHALMIC OINT
1.0000 "application " | TOPICAL_OINTMENT | Freq: Three times a day (TID) | OPHTHALMIC | Status: DC
  Administered 2020-02-23: 1 via OPHTHALMIC

## 2020-02-23 MED ORDER — ROCURONIUM BROMIDE 10 MG/ML (PF) SYRINGE: PREFILLED_SYRINGE | Freq: Once | INTRAVENOUS | Status: AC

## 2020-02-23 MED ORDER — PROPOFOL 1000 MG/100ML IV EMUL
INTRAVENOUS | Status: AC
  Administered 2020-02-23: 15 ug/kg/min via INTRAVENOUS
  Filled 2020-02-23: qty 100

## 2020-02-23 MED ORDER — ESCITALOPRAM OXALATE 10 MG PO TABS
10.0000 mg | ORAL_TABLET | Freq: Every day | ORAL | Status: DC
  Administered 2020-02-24 – 2020-02-26 (×3): 10 mg
  Filled 2020-02-23 (×3): qty 1

## 2020-02-23 MED ORDER — ROCURONIUM BROMIDE 10 MG/ML (PF) SYRINGE
PREFILLED_SYRINGE | INTRAVENOUS | Status: AC
  Administered 2020-02-23: 100 mg via INTRAVENOUS
  Filled 2020-02-23: qty 10

## 2020-02-23 MED ORDER — FENTANYL CITRATE (PF) 100 MCG/2ML IJ SOLN: 100.0000 ug | Freq: Once | INTRAMUSCULAR | Status: AC

## 2020-02-23 MED ORDER — PROPOFOL 1000 MG/100ML IV EMUL
25.0000 ug/kg/min | INTRAVENOUS | Status: DC
  Administered 2020-02-23 (×2): 60 ug/kg/min via INTRAVENOUS
  Administered 2020-02-24: 45 ug/kg/min via INTRAVENOUS
  Administered 2020-02-24: 50 ug/kg/min via INTRAVENOUS
  Administered 2020-02-24: 45 ug/kg/min via INTRAVENOUS
  Administered 2020-02-24: 65 ug/kg/min via INTRAVENOUS
  Administered 2020-02-24: 45 ug/kg/min via INTRAVENOUS
  Administered 2020-02-24: 55 ug/kg/min via INTRAVENOUS
  Administered 2020-02-24 (×2): 50 ug/kg/min via INTRAVENOUS
  Administered 2020-02-25 (×6): 40 ug/kg/min via INTRAVENOUS
  Administered 2020-02-25: 50 ug/kg/min via INTRAVENOUS
  Administered 2020-02-26 (×3): 40 ug/kg/min via INTRAVENOUS
  Filled 2020-02-23: qty 100
  Filled 2020-02-23 (×4): qty 200
  Filled 2020-02-23 (×4): qty 100
  Filled 2020-02-23: qty 200
  Filled 2020-02-23: qty 100
  Filled 2020-02-23: qty 200
  Filled 2020-02-23 (×2): qty 100

## 2020-02-23 MED ORDER — QUETIAPINE FUMARATE 50 MG PO TABS
50.0000 mg | ORAL_TABLET | Freq: Two times a day (BID) | ORAL | Status: DC
  Administered 2020-02-23 – 2020-02-26 (×6): 50 mg
  Filled 2020-02-23 (×6): qty 1

## 2020-02-23 MED ORDER — APIXABAN 5 MG PO TABS
10.0000 mg | ORAL_TABLET | Freq: Two times a day (BID) | ORAL | Status: AC
  Administered 2020-02-23 – 2020-02-25 (×5): 10 mg
  Filled 2020-02-23 (×5): qty 2

## 2020-02-23 MED ORDER — PROPOFOL 1000 MG/100ML IV EMUL: 5.0000 ug/kg/min | INTRAVENOUS | Status: DC

## 2020-02-23 MED ORDER — FENTANYL CITRATE (PF) 100 MCG/2ML IJ SOLN
INTRAMUSCULAR | Status: AC
  Administered 2020-02-23: 50 ug via INTRAVENOUS
  Filled 2020-02-23: qty 2

## 2020-02-23 MED ORDER — SODIUM CHLORIDE 0.9 % IV SOLN
2.0000 g | Freq: Once | INTRAVENOUS | Status: DC
  Filled 2020-02-23: qty 20

## 2020-02-23 MED ORDER — NOREPINEPHRINE 4 MG/250ML-% IV SOLN
INTRAVENOUS | Status: AC
  Filled 2020-02-23: qty 250

## 2020-02-23 MED ORDER — VECURONIUM BROMIDE 10 MG IV SOLR: 10.0000 mg | Freq: Once | INTRAVENOUS | Status: AC

## 2020-02-23 MED ORDER — DEXMEDETOMIDINE HCL IN NACL 400 MCG/100ML IV SOLN
0.2000 ug/kg/h | INTRAVENOUS | Status: DC
  Administered 2020-02-23: 0.3 ug/kg/h via INTRAVENOUS
  Administered 2020-02-23: 0.4 ug/kg/h via INTRAVENOUS
  Filled 2020-02-23 (×3): qty 100

## 2020-02-23 MED ORDER — MIDAZOLAM HCL 2 MG/2ML IJ SOLN
INTRAMUSCULAR | Status: AC
  Administered 2020-02-23: 2 mg via INTRAVENOUS
  Filled 2020-02-23: qty 2

## 2020-02-23 MED ORDER — FENTANYL BOLUS VIA INFUSION
50.0000 ug | INTRAVENOUS | Status: DC | PRN
  Filled 2020-02-23: qty 50

## 2020-02-23 MED ORDER — SODIUM CHLORIDE 0.9 % IV SOLN
0.0000 ug/kg/min | INTRAVENOUS | Status: DC
  Administered 2020-02-23: 3 ug/kg/min via INTRAVENOUS
  Administered 2020-02-24: 3.5 ug/kg/min via INTRAVENOUS
  Administered 2020-02-24: 3 ug/kg/min via INTRAVENOUS
  Administered 2020-02-24 – 2020-02-26 (×4): 3.5 ug/kg/min via INTRAVENOUS
  Filled 2020-02-23 (×9): qty 20

## 2020-02-23 MED ORDER — MELATONIN 3 MG PO TABS: 3.0000 mg | ORAL_TABLET | Freq: Every day | ORAL | Status: DC

## 2020-02-23 MED ORDER — MIDAZOLAM HCL 2 MG/2ML IJ SOLN: 2.0000 mg | Freq: Once | INTRAMUSCULAR | Status: AC

## 2020-02-23 MED ORDER — NOREPINEPHRINE 4 MG/250ML-% IV SOLN
0.0000 ug/min | INTRAVENOUS | Status: DC
  Administered 2020-02-23: 5 ug/min via INTRAVENOUS
  Administered 2020-02-24: 15 ug/min via INTRAVENOUS
  Filled 2020-02-23: qty 250

## 2020-02-23 MED ORDER — VECURONIUM BROMIDE 10 MG IV SOLR
INTRAVENOUS | Status: AC
  Administered 2020-02-23: 10 mg via INTRAVENOUS
  Filled 2020-02-23: qty 10

## 2020-02-23 MED ORDER — FENTANYL CITRATE (PF) 100 MCG/2ML IJ SOLN: 50.0000 ug | Freq: Once | INTRAMUSCULAR | Status: DC

## 2020-02-23 NOTE — Procedures (Signed)
Intubation Procedure Note  Brianna Velasquez  [id number]  09/06/1970  Date:02/23/20  Time:4:23 PM   Provider Performing:Aerilyn Slee    Procedure: Intubation (31500)  Indication(s) Respiratory Failure  Consent Risks of the procedure as well as the alternatives and risks of each were explained to the patient and/or caregiver.  Consent for the procedure was obtained and is signed in the bedside chart   Anesthesia Etomidate, Versed, Fentanyl and Rocuronium   Time Out Verified patient identification, verified procedure, site/side was marked, verified correct patient position, special equipment/implants available, medications/allergies/relevant history reviewed, required imaging and test results available.   Sterile Technique Usual hand hygeine, masks, and gloves were used   Procedure Description Patient positioned in bed supine.  Sedation given as noted above.  Patient was intubated with endotracheal tube using Glidescope.  View was Grade 1 full glottis .  Number of attempts was 1.  Colorimetric CO2 detector was consistent with tracheal placement.   Complications/Tolerance None; patient tolerated the procedure well. Chest X-ray is ordered to verify placement.   EBL Minimal   Specimen(s) None

## 2020-02-23 NOTE — Progress Notes (Signed)
eLink Physician-Brief Progress Note Patient Name: Brianna Velasquez DOB: [birth year] MRN: [id number]   Date of Service  02/23/2020  HPI/Events of Note  Saturation 88%, patient remains high risk but is okay for now.  eICU Interventions  Continue close monitorinrg        Felesia Stahlecker U Arriah Wadle 02/23/2020, 1:35 AM

## 2020-02-23 NOTE — Progress Notes (Signed)
49 yo F COVID-19 ARDS  To bedside for refractory hypoxemia. SpO2 67%  ABC 7.23/80/34 PRVC RR 34 FiO2 100% PEEP 16   Critically ill F, intubated sedated, cyanotic appearing ETT secure anicteric sclera Tachycardic, regular. Sluggish cap refill Diminished bilateral breath sounds with diffuse rhonchi, but no absent sounds. Symmetrical chest expansion Foley with yellow urine Obese abdomen with redundant tissue, non-distended, soft  S/p 1x dose of NMB with marginal improvement in SpO2 while supinated.   ARDS due to COVID-19 P -prone now -STAT CXR r/o ptx. Has bilateral breath sounds, low suspicion  -RASS -4 -5, PRN NMB  -ABG in 1 hour       Discussed with family that patient is critically ill and on essentially maximal ventilatory support. With addition on NMB and prone position, hope for some improvement in oxygenation, however if pt remains hypoxemic is at risk for end organ damage and cardiac arrest. Family somewhat in shock at how sick the patient is.  Desire Full Code   Additional critical care time 35 minutes   Tessie Fass MSN, AGACNP-BC [facility] Pulmonary/Critical Care Medicine  [phone] 02/23/2020, 6:12 PM

## 2020-02-23 NOTE — Progress Notes (Signed)
Nitric stopped at this time per MD.

## 2020-02-23 NOTE — Procedures (Signed)
Arterial Catheter Insertion Procedure Note  Eden Toohey  [id number]  [birth year]  Date:02/23/20  Time:4:38 PM    Provider Performing: Peggye Form    Procedure: Insertion of Arterial Line (38182) without US guidance  Indication(s) Blood pressure monitoring and/or need for frequent ABGs  Consent Unable to obtain consent due to emergent nature of procedure.  Anesthesia None   Time Out Verified patient identification, verified procedure, site/side was marked, verified correct patient position, special equipment/implants available, medications/allergies/relevant history reviewed, required imaging and test results available.   Sterile Technique Maximal sterile technique including full sterile barrier drape, hand hygiene, sterile gown, sterile gloves, mask, hair covering, sterile ultrasound probe cover (if used).   Procedure Description Area of catheter insertion was cleaned with chlorhexidine and draped in sterile fashion. With real-time ultrasound guidance an arterial catheter was placed into the right radial artery.  Appropriate arterial tracings confirmed on monitor.     Complications/Tolerance None; patient tolerated the procedure well.   EBL Minimal   Specimen(s) None

## 2020-02-23 NOTE — Progress Notes (Signed)
Contacted dr Reinaldo Raddle to notify that levophed is at 10 mcg/min infusing Into 18 G LFA . This is max to administer per protocol into PIV. bp 92/65 map 72. Hr 148 ST. See emar to see all meds infusing. Dr Reinaldo Raddle stated to notify her if the need to go up on levophed goes above 15 mcg/min and ELink will be notified at that time to address the Need for CVC placement. Continuing to monitor.

## 2020-02-23 NOTE — Progress Notes (Signed)
Assisted with proneing. Marland Kitchen

## 2020-02-23 NOTE — Progress Notes (Signed)
ABG results called to Cheri Fowler, MD. Order to increase RR 35 and prone.

## 2020-02-23 NOTE — Progress Notes (Addendum)
Pt started on Nitric at 10PPM per MD order.

## 2020-02-23 NOTE — Progress Notes (Signed)
NAME:  Brianna Velasquez, MRN:  [id number], DOB:  02-14-1971, LOS: 12 ADMISSION DATE:  02/10/2020, CONSULTATION DATE: 02/18/2020 REFERRING MD: Dr. Lockie Mola, CHIEF COMPLAINT: Hypoxemic respiratory failure  Brief History   49 year old obese woman, diagnosed with COVID-19 4 days PTA with approximately 1 week of symptoms at presentation.  Found to have hypoxemic respiratory failure, initially on BiPAP, subsequently comfortable but marginal on 30 L/min HF [place] +1.00 mask.   Past Medical History  Renal calculi Hypothyroidism Depression MRSA infection following cesarean section  Significant Hospital Events   Brought to ED on BiPAP, hypoxemic 10/17, admitted ICU level care  Consults:    Procedures:    Significant Diagnostic Tests:  10/19 CXR>>Stable diffuse bilateral lung opacities are noted concerning for multifocal pneumonia. 10/19 Echo>>RV Apical Hyperkinesis relative to the rest of the RV. This can be associated with Pulmonary Embolism, Grade 1 diastolic HF 10/21LE dopplers>>Negative for DVT per Doppler>> Limited Study  Micro Data:  SARSCoV2 10/17 >> positive 10/17 MRSA PCR >> neg 10/17 BCx 2 >> negative  Antimicrobials:  Remdesivir x1 10/17 Baricitinib 10/17 >> Dexamethasone 10/17 >>   Interim history/subjective:  Patient is unable to tolerate coming off of BiPAP, she desatted to low 50s with high flow nasal cannula 100% FiO2, she was put back on BiPAP again.  She is refusing to be intubated Objective   Blood pressure 111/74, pulse 86, temperature 98.2 F (36.8 C), temperature source Axillary, resp. rate (!) 29, height 4' 11.02" (1.499 m), weight 106.3 kg, SpO2 (!) 87 %.    Vent Mode: BIPAP FiO2 (%):  [80 %-100 %] 100 % Set Rate:  [18 bmp] 18 bmp PEEP:  [12 cmH20-15 cmH20] 15 cmH20 Plateau Pressure:  [19 cmH20-20 cmH20] 20 cmH20   Intake/Output Summary (Last 24 hours) at 02/23/2020 1044 Last data filed at 02/23/2020 0700 Gross per 24 hour  Intake 1529.7 ml  Output  400 ml  Net 1129.7 ml   Filed Weights   02/21/20 0257 02/22/20 0500 02/23/20 0410  Weight: 109.4 kg 108.6 kg 106.3 kg   General: Middle-aged morbidly obese female, currently on BiPAP HEENT: NCAT, MM pink/moist, No LAD, No JVD Neuro: MAE x 3, Alert to person and place ( sleepy at present), following commands CV: s1s2 rrr, no m/r/g, SR per tele PULM: Coarse bilateral breath sounds, no wheezes or rhonchi GI: soft, bsx4 active, Pt on abdomen at present Extremities: warm/dry, 1+ edema , No cyanosis, mottling  Skin: no rashes or lesions   Resolved Hospital Problem list   Hypocalcemia  Assessment & Plan:   Acute hypoxemic respiratory failure due to ARDS from COVID-19 pneumonia Suspect underlying OHS, Requiring bipap. Remains high risk for intubation  Patient did not tolerate coming off of BiPAP, her O2 sat dropped to 50s, she became confused, had to put back on She continued to refuse endotracheal intubation I am not sure she is able to make this decision considering severe hypoxia currently We will call her family and explained the situation that she is at high risk of intubation as she has not been able to come off of BiPAP in last few days Goal O2 saturations for goal SpO2 > 85%, goal has been achieved with 70% FiO2 on BiPAP Continue to encourage self proning She completed therapy with steroid for 10 days Continue baricitinib  Suspected pulmonary Embolism IV heparin was switched to apixaban  Steroid-induced hyperglycemia Patient's blood sugars are better controlled now Continue tube feeds Continue SSI, add Levemir 5 units bid  Hypothyroidism Continue synthroid  Hyperlipidemia  -continue holding home zetia and lipitor  Nutrition Tube feeds, advance as tolerated Continue Glucerna nutritional supplements    Best practice:  Diet: Tube feeds Pain/Anxiety/Delirium protocol (if indicated): low dose Klonopin VAP protocol (if indicated): NA DVT prophylaxis: Apixaban GI  prophylaxis: pepcid Glucose control: SSI  Mobility: progress as tolerated  Code Status: Full  Family Communication: Patient's family was updated Disposition: ICU  Labs   CBC: Recent Labs  Lab 02/17/20 0549 02/17/20 0627 02/18/20 0419 02/21/20 0704  WBC 10.3  --  14.3* 13.5*  HGB 13.7 13.9 13.3 14.8  HCT 43.3 41.0 41.5 46.1*  MCV 93.5  --  92.8 91.7  PLT 338  --  403* 374    Basic Metabolic Panel: Recent Labs  Lab 02/17/20 0549 02/17/20 0627 02/19/20 1652 02/20/20 0935 02/20/20 1644 02/21/20 0704  NA 140 138  --   --   --  140  K 4.9 4.4  --   --   --  4.2  CL 101  --   --   --   --  102  CO2 28  --   --   --   --  26  GLUCOSE 145*  --   --   --   --  113*  BUN 19  --   --   --   --  22*  CREATININE 0.71  --   --   --   --  0.67  CALCIUM 7.3*  --   --   --   --  8.1*  MG 2.2  --  2.2 2.2 2.1 2.2  PHOS  --   --  4.2 4.2 3.8 3.6   GFR: Estimated Creatinine Clearance: 89.8 mL/min (by C-G formula based on SCr of 0.67 mg/dL). Recent Labs  Lab 02/17/20 0549 02/18/20 0419 02/21/20 0704  WBC 10.3 14.3* 13.5*    Liver Function Tests: Recent Labs  Lab 02/17/20 0549  AST 44*  ALT 46*  ALKPHOS 90  BILITOT 0.7  PROT 6.3*  ALBUMIN 2.4*   No results for input(s): LIPASE, AMYLASE in the last 168 hours. No results for input(s): AMMONIA in the last 168 hours.  ABG    Component Value Date/Time   PHART 7.493 (H) 02/17/2020 0627   PCO2ART 38.8 02/17/2020 0627   PO2ART 53 (L) 02/17/2020 0627   HCO3 30.0 (H) 02/17/2020 0627   TCO2 31 02/17/2020 0627   O2SAT 90.0 02/17/2020 0627     Coagulation Profile: No results for input(s): INR, PROTIME in the last 168 hours.  Cardiac Enzymes: No results for input(s): CKTOTAL, CKMB, CKMBINDEX, TROPONINI in the last 168 hours.  HbA1C: Hgb A1c MFr Bld  Date/Time Value Ref Range Status  02/19/2020 10:21 PM 7.4 (H) 4.8 - 5.6 % Final    Comment:    (NOTE)         Prediabetes: 5.7 - 6.4         Diabetes: >6.4          Glycemic control for adults with diabetes: <7.0     CBG: Recent Labs  Lab 02/22/20 1607 02/22/20 1920 02/22/20 2330 02/23/20 0316 02/23/20 0806  GLUCAP 160* 180* 154* 152* 224*    Critical care time:  minutes     Total critical care time: 36 minutes  Performed by: Cheri Fowler   Critical care time was exclusive of separately billable procedures and treating other patients.   Critical care was necessary to treat or prevent imminent or life-threatening  deterioration.   Critical care was time spent personally by me on the following activities: development of treatment plan with patient and/or surrogate as well as nursing, discussions with consultants, evaluation of patient's response to treatment, examination of patient, obtaining history from patient or surrogate, ordering and performing treatments and interventions, ordering and review of laboratory studies, ordering and review of radiographic studies, pulse oximetry and re-evaluation of patient's condition.   Cheri Fowler MD Critical care physician [facility]  Pager: [phone] Mobile: [phone]

## 2020-02-23 NOTE — Progress Notes (Signed)
eLink Physician-Brief Progress Note Patient Name: Burkley Dech DOB: 1970-10-11 MRN: [id number]   Date of Service  02/23/2020  HPI/Events of Note  Patient with agitated delirium.  eICU Interventions  Precedex 0.2-0.6 mcg / kg / hour ordered for sedation and anxiolysis without respiratory depression.        Thomasene Lot Larosa Rhines 02/23/2020, 2:12 AM

## 2020-02-23 NOTE — Plan of Care (Signed)
I came to bedside to check on patient, she was hypoxic with sats 65-70, very dyssynchronous with the ventilator, not getting adequate tidal volumes at all.  She was sedated on propofol and fentanyl- bolused her fentanyl and gave prn vecuronium with improvement in sats to mid 80s.  Patient has not been satting above low-mid 80s since proning.   Assessment: Acute hypoxic respiratory failure due to COVID-19, intubated and proned today.   Plan: - start continous paralytic- nimbex.  Continued sedation with RASS -5 - start inhaled nitric oxide - continue proning - lasix 20mg  x1 IV

## 2020-02-24 ENCOUNTER — Inpatient Hospital Stay (HOSPITAL_COMMUNITY): Payer: Medicaid Other

## 2020-02-24 DIAGNOSIS — R0902 Hypoxemia: Secondary | ICD-10-CM | POA: Diagnosis not present

## 2020-02-24 DIAGNOSIS — U071 COVID-19: Secondary | ICD-10-CM | POA: Diagnosis not present

## 2020-02-24 DIAGNOSIS — R579 Shock, unspecified: Secondary | ICD-10-CM

## 2020-02-24 DIAGNOSIS — J9601 Acute respiratory failure with hypoxia: Secondary | ICD-10-CM

## 2020-02-24 DIAGNOSIS — J80 Acute respiratory distress syndrome: Secondary | ICD-10-CM | POA: Diagnosis not present

## 2020-02-24 DIAGNOSIS — N170 Acute kidney failure with tubular necrosis: Secondary | ICD-10-CM

## 2020-02-24 LAB — POCT I-STAT 7, (LYTES, BLD GAS, ICA,H+H)
Acid-base deficit: 1 mmol/L (ref 0.0–2.0)
Acid-base deficit: 2 mmol/L (ref 0.0–2.0)
Bicarbonate: 31.3 mmol/L — ABNORMAL HIGH (ref 20.0–28.0)
Bicarbonate: 30.1 mmol/L — ABNORMAL HIGH (ref 20.0–28.0)
Calcium, Ion: 1.16 mmol/L (ref 1.15–1.40)
Calcium, Ion: 1.07 mmol/L — ABNORMAL LOW (ref 1.15–1.40)
HCT: 46 % (ref 36.0–46.0)
HCT: 45 % (ref 36.0–46.0)
Hemoglobin: 15.6 g/dL — ABNORMAL HIGH (ref 12.0–15.0)
Hemoglobin: 15.3 g/dL — ABNORMAL HIGH (ref 12.0–15.0)
O2 Saturation: 84 %
O2 Saturation: 84 %
Potassium: 5 mmol/L (ref 3.5–5.1)
Potassium: 4.9 mmol/L (ref 3.5–5.1)
Sodium: 142 mmol/L (ref 135–145)
Sodium: 141 mmol/L (ref 135–145)
TCO2: 34 mmol/L — ABNORMAL HIGH (ref 22–32)
TCO2: 33 mmol/L — ABNORMAL HIGH (ref 22–32)
pCO2 arterial: 88.7 mmHg (ref 32.0–48.0)
pCO2 arterial: 92.9 mmHg (ref 32.0–48.0)
pH, Arterial: 7.155 — CL (ref 7.350–7.450)
pH, Arterial: 7.119 — CL (ref 7.350–7.450)
pO2, Arterial: 65 mmHg — ABNORMAL LOW (ref 83.0–108.0)
pO2, Arterial: 68 mmHg — ABNORMAL LOW (ref 83.0–108.0)

## 2020-02-24 LAB — GLUCOSE, CAPILLARY
Glucose-Capillary: 258 mg/dL — ABNORMAL HIGH (ref 70–99)
Glucose-Capillary: 259 mg/dL — ABNORMAL HIGH (ref 70–99)
Glucose-Capillary: 235 mg/dL — ABNORMAL HIGH (ref 70–99)
Glucose-Capillary: 141 mg/dL — ABNORMAL HIGH (ref 70–99)
Glucose-Capillary: 162 mg/dL — ABNORMAL HIGH (ref 70–99)

## 2020-02-24 LAB — COOXEMETRY PANEL
Carboxyhemoglobin: 1.1 % (ref 0.5–1.5)
Carboxyhemoglobin: 1.1 % (ref 0.5–1.5)
Methemoglobin: 1.2 % (ref 0.0–1.5)
Methemoglobin: 1.2 % (ref 0.0–1.5)
O2 Saturation: 88.3 %
O2 Saturation: 82.6 %
Total hemoglobin: 14.5 g/dL (ref 12.0–16.0)
Total hemoglobin: 13.7 g/dL (ref 12.0–16.0)

## 2020-02-24 LAB — BASIC METABOLIC PANEL
Anion gap: 14 (ref 5–15)
BUN: 44 mg/dL — ABNORMAL HIGH (ref 6–20)
CO2: 26 mmol/L (ref 22–32)
Calcium: 8.1 mg/dL — ABNORMAL LOW (ref 8.9–10.3)
Chloride: 102 mmol/L (ref 98–111)
Creatinine, Ser: 1.75 mg/dL — ABNORMAL HIGH (ref 0.44–1.00)
GFR, Estimated: 35 mL/min — ABNORMAL LOW (ref >60)
Glucose, Bld: 258 mg/dL — ABNORMAL HIGH (ref 70–99)
Potassium: 4.7 mmol/L (ref 3.5–5.1)
Sodium: 142 mmol/L (ref 135–145)

## 2020-02-24 LAB — CBC WITH DIFFERENTIAL/PLATELET
Abs Immature Granulocytes: 0.49 10*3/uL — ABNORMAL HIGH (ref 0.00–0.07)
Basophils Absolute: 0.1 10*3/uL (ref 0.0–0.1)
Basophils Relative: 0 %
Eosinophils Absolute: 0.1 10*3/uL (ref 0.0–0.5)
Eosinophils Relative: 0 %
HCT: 47.8 % — ABNORMAL HIGH (ref 36.0–46.0)
Hemoglobin: 14.2 g/dL (ref 12.0–15.0)
Immature Granulocytes: 2 %
Lymphocytes Relative: 2 %
Lymphs Abs: 0.5 10*3/uL — ABNORMAL LOW (ref 0.7–4.0)
MCH: 29.8 pg (ref 26.0–34.0)
MCHC: 29.7 g/dL — ABNORMAL LOW (ref 30.0–36.0)
MCV: 100.4 fL — ABNORMAL HIGH (ref 80.0–100.0)
Monocytes Absolute: 1.1 10*3/uL — ABNORMAL HIGH (ref 0.1–1.0)
Monocytes Relative: 5 %
Neutro Abs: 22.4 10*3/uL — ABNORMAL HIGH (ref 1.7–7.7)
Neutrophils Relative %: 91 %
Platelets: 397 10*3/uL (ref 150–400)
RBC: 4.76 MIL/uL (ref 3.87–5.11)
RDW: 14.8 % (ref 11.5–15.5)
WBC: 24.8 10*3/uL — ABNORMAL HIGH (ref 4.0–10.5)
nRBC: 0.1 % (ref 0.0–0.2)

## 2020-02-24 LAB — BLOOD GAS, ARTERIAL
Acid-base deficit: 3.4 mmol/L — ABNORMAL HIGH (ref 0.0–2.0)
Bicarbonate: 26.5 mmol/L (ref 20.0–28.0)
Drawn by: 51155
FIO2: 100
O2 Saturation: 91.2 %
Patient temperature: 37
pCO2 arterial: 105 mmHg (ref 32.0–48.0)
pH, Arterial: 7.033 — CL (ref 7.350–7.450)
pO2, Arterial: 75.7 mmHg — ABNORMAL LOW (ref 83.0–108.0)

## 2020-02-24 LAB — PROTIME-INR
INR: 1.3 — ABNORMAL HIGH (ref 0.8–1.2)
Prothrombin Time: 15.7 seconds — ABNORMAL HIGH (ref 11.4–15.2)

## 2020-02-24 LAB — MAGNESIUM: Magnesium: 2.3 mg/dL (ref 1.7–2.4)

## 2020-02-24 LAB — PHOSPHORUS: Phosphorus: 7.6 mg/dL — ABNORMAL HIGH (ref 2.5–4.6)

## 2020-02-24 MED ORDER — SODIUM BICARBONATE 8.4 % IV SOLN
50.0000 meq | Freq: Once | INTRAVENOUS | Status: AC
  Administered 2020-02-24: 50 meq via INTRAVENOUS
  Filled 2020-02-24: qty 50

## 2020-02-24 MED ORDER — VASOPRESSIN 20 UNITS/100 ML INFUSION FOR SHOCK
0.0000 [IU]/min | INTRAVENOUS | Status: DC
  Administered 2020-02-24 – 2020-02-25 (×4): 0.03 [IU]/min via INTRAVENOUS
  Filled 2020-02-24 (×5): qty 100

## 2020-02-24 MED ORDER — LACTATED RINGERS IV BOLUS
1000.0000 mL | Freq: Once | INTRAVENOUS | Status: AC
  Administered 2020-02-24: 1000 mL via INTRAVENOUS

## 2020-02-24 MED ORDER — NOREPINEPHRINE 16 MG/250ML-% IV SOLN
0.0000 ug/min | INTRAVENOUS | Status: DC
  Administered 2020-02-24: 34 ug/min via INTRAVENOUS
  Administered 2020-02-24: 24 ug/min via INTRAVENOUS
  Administered 2020-02-24: 37 ug/min via INTRAVENOUS
  Administered 2020-02-25 (×2): 40 ug/min via INTRAVENOUS
  Administered 2020-02-25: 38 ug/min via INTRAVENOUS
  Administered 2020-02-26: 36 ug/min via INTRAVENOUS
  Administered 2020-02-26: 37 ug/min via INTRAVENOUS
  Filled 2020-02-24 (×6): qty 250
  Filled 2020-02-24: qty 500

## 2020-02-24 MED ORDER — INSULIN DETEMIR 100 UNIT/ML ~~LOC~~ SOLN
15.0000 [IU] | Freq: Two times a day (BID) | SUBCUTANEOUS | Status: DC
  Administered 2020-02-24 – 2020-02-26 (×5): 15 [IU] via SUBCUTANEOUS
  Filled 2020-02-24 (×6): qty 0.15

## 2020-02-24 MED ORDER — FUROSEMIDE 10 MG/ML IJ SOLN
60.0000 mg | Freq: Once | INTRAMUSCULAR | Status: AC
  Administered 2020-02-24: 60 mg via INTRAVENOUS
  Filled 2020-02-24: qty 6

## 2020-02-24 MED ORDER — SODIUM CHLORIDE 0.9 % IV SOLN
2.0000 g | Freq: Two times a day (BID) | INTRAVENOUS | Status: DC
  Administered 2020-02-24 – 2020-02-25 (×3): 2 g via INTRAVENOUS
  Filled 2020-02-24 (×3): qty 2

## 2020-02-24 MED ORDER — LINEZOLID 600 MG/300ML IV SOLN
600.0000 mg | Freq: Two times a day (BID) | INTRAVENOUS | Status: DC
  Administered 2020-02-24 – 2020-02-26 (×5): 600 mg via INTRAVENOUS
  Filled 2020-02-24 (×6): qty 300

## 2020-02-24 NOTE — Progress Notes (Addendum)
Nutrition Follow-up  RD working remotely.  DOCUMENTATION CODES:   Morbid obesity  INTERVENTION:   Tube feeding via Cortrak tube (gastric):  Vital 1.5 @ 60 ml/hr (1440 ml/ day) 90 ml Prosource TF BID  Regimen provides 2320 kcal, 141 gm protein, 1094 ml free water daily, meeting 100% of estimated kcal and protein needs.   NUTRITION DIAGNOSIS:   Increased nutrient needs related to catabolic illness (COVID+ PNA) as evidenced by estimated needs.  Ongoing  GOAL:   Patient will meet greater than or equal to 90% of their needs  Met with TF  MONITOR:   Vent status, Labs, Weight trends, TF tolerance, Skin, I & O's  REASON FOR ASSESSMENT:   Consult Enteral/tube feeding initiation and management  ASSESSMENT:   Pt with PMH of depression, renal calculi, and hypothyroidism is admitted with COVID-19 PNA after one week of symptoms.  10/25- cortrak placed (gastric) 10/29- intubated  Patient is currently intubated on ventilator support MV: 11.3 L/min Temp (24hrs), Avg:98.7 F (37.1 C), Min:97.9 F (36.6 C), Max:99.9 F (37.7 C)  Reviewed I/O's: +2.1 L x 24 hours and +3.2 L since admission  UOP: 225 ml x 24 hours  MAP: 70  Per MD notes, pt with multi-organ failure with worsening respiratory failure. Pt has been transitioned to DNR and with poor prognosis. She continues to be proned.   Vital 1.5 currently infusing via cortrak tube @ 60 ml/hr; pt also receiving 90 ml Prosource TF BID. Current regimen providing 2320 kcal, 141 gm protein, 1094 ml free water daily, which meets 100% of estimated kcal and protein needs.   Medications reviewed and include senna-docusate, nimbex, and vasopressin.   Labs reviewed: CBGS: 568-127 (inpatient orders for glycemic control are 0-20 units insulin aspart every 4 hours and 15 units insulin detemir BID).   Diet Order:   Diet Order            DIET SOFT Room service appropriate? Yes; Fluid consistency: Thin  Diet effective now                  EDUCATION NEEDS:   No education needs have been identified at this time  Skin:  Skin Assessment: Reviewed RN Assessment  Last BM:  02/22/20  Height:   Ht Readings from Last 1 Encounters:  02/12/2020 4' 11.02" (1.499 m)    Weight:   Wt Readings from Last 1 Encounters:  02/24/20 103.8 kg    Ideal Body Weight:  44.6 kg  BMI:  Body mass index is 46.19 kg/m.  Estimated Nutritional Needs:   Kcal:  2200-2400  Protein:  110-140 grams  Fluid:  2 L/day    Loistine Chance, RD, LDN, [place] Registered Dietitian II Certified Diabetes Care and Education Specialist Please refer to [facility] for RD and/or RD on-call/weekend/after hours pager

## 2020-02-24 NOTE — Progress Notes (Signed)
Contacted E-Link to notify MD of bp manual 77/59 map 66. Levophed @ 15 mcg/min infusing . Contacted regarding need for CVC line.

## 2020-02-24 NOTE — Progress Notes (Signed)
Patient's head turned and arms rotated without complications. Will continue to monitor

## 2020-02-24 NOTE — Progress Notes (Signed)
NAME:  Brianna Velasquez, MRN:  [id number], DOB:  October 12, 1970, LOS: 13 ADMISSION DATE:  01/31/2020, CONSULTATION DATE: 02/16/2020 REFERRING MD: Dr. Lockie Mola, CHIEF COMPLAINT: Hypoxemic respiratory failure  Brief History   49 year old obese woman, diagnosed with COVID-19 4 days PTA with approximately 1 week of symptoms at presentation.  Found to have hypoxemic respiratory failure, initially on BiPAP, subsequently comfortable but marginal on 30 L/min HF [place] +1.00 mask.   Past Medical History  Renal calculi Hypothyroidism Depression MRSA infection following cesarean section  Significant Hospital Events   Brought to ED on BiPAP, hypoxemic 10/17, admitted ICU level care, cyanotic and intubated 10/29  Consults:    Procedures:  ETT 10/29 IJ TLC 10/30  Significant Diagnostic Tests:  10/19 CXR>>Stable diffuse bilateral lung opacities are noted concerning for multifocal pneumonia. 10/19 Echo>>RV Apical Hyperkinesis relative to the rest of the RV. This can be associated with Pulmonary Embolism, Grade 1 diastolic HF 10/21LE dopplers>>Negative for DVT per Doppler>> Limited Study  Micro Data:  SARSCoV2 10/17 >> positive 10/17 MRSA PCR >> neg 10/17 BCx 2 >> negative  Antimicrobials:  Remdesivir x1 10/17 Baricitinib 10/17 >> Linezolid,   Interim history/subjective:  Intubated yesterday afternoon. Refractory hypoxemia and extreme dead space. Proned. PH 7.1 on 8 cc/kg, PO2 60s on 100% FiO2 and 16 PEEP paralyzed and proned, driving pressure 87-56. Paralyzed. CXR with what appear denser infiltrates centrally Objective   Blood pressure 99/62, pulse (!) 142, temperature 99.9 F (37.7 C), temperature source Axillary, resp. rate (!) 35, height 4' 11.02" (1.499 m), weight 103.8 kg, SpO2 92 %.    Vent Mode: PRVC FiO2 (%):  [100 %] 100 % Set Rate:  [18 bmp-35 bmp] 35 bmp Vt Set:  [320 mL-350 mL] 350 mL PEEP:  [15 cmH20-16 cmH20] 16 cmH20 Plateau Pressure:  [33 cmH20] 33 cmH20   Intake/Output  Summary (Last 24 hours) at 02/24/2020 0958 Last data filed at 02/24/2020 0944 Gross per 24 hour  Intake 2645.1 ml  Output 380 ml  Net 2265.1 ml   Filed Weights   02/22/20 0500 02/23/20 0410 02/24/20 0437  Weight: 108.6 kg 106.3 kg 103.8 kg   General: Middle-aged morbidly obese female, proned Neuro: paralyzed CV: Tach to 140s, NSR, no m/r/g,  PULM: Coarse distant bilateral breath sounds, no wheezes or rhonchi Extremities: warm/dry, 1+ edema , No cyanosis, mottling  Skin: no rashes or lesions   Resolved Hospital Problem list   Hypocalcemia  Assessment & Plan:   Acute hypoxemic respiratory failure due to ARDS from COVID-19 pneumonia Intubated given progressive hypoxemia. Refractory hypoxemia paralyzed and proned with pO2 68. Unable to adequately compensate for resp acidosis given AKI.  --She completed therapy with steroid for 10 days --Continue baricitinib  Acute renal failure, oliguric: Due to hypoxemia and ATN in setting of hypotension due to refractory acidosis. --CTM, did not urinate after lasix --Not dialysis candidate given worsening respiratory status driving kidney injury, would not tolerate CRRT due to progressive hypotension  Shock: Due to metabolic disturbances, possible sepsis (leukocytosis, tachycardia) without clear source. Hgb stable on anticoagulation. -1L LR bolus - vaso, NE, MAP > 65 - Check cooximetry off central line (RV failure with PE would expect low, sepsis would expect high)  Sepsis: Unclear source, suspect Pna given worsened CXR. Rising WBC and tachy. No fever.  --add linezolid, cefepime --obtain cultures  Suspected pulmonary Embolism IV heparin was switched to apixaban  Steroid-induced hyperglycemia Continue tube feeds Continue SSI, add Levemir 15 units bid  Hypothyroidism Continue synthroid  Best practice:  Diet: Tube feeds Pain/Anxiety/Delirium protocol (if indicated): low dose Klonopin VAP protocol (if indicated): NA DVT  prophylaxis: Apixaban GI prophylaxis: pepcid Glucose control: SSI  Mobility: progress as tolerated  Code Status: Full  Family Communication: Discussed at length with Mother Mickeal Skinner) AM 10/30 - multi organ failure with worsening respiratory failure, recommend DNR which was agreed to, suspect she will pass as soon as today, continuing to do all we can Disposition: ICU  Labs   CBC: Recent Labs  Lab 02/18/20 0419 02/18/20 0419 02/21/20 0704 02/23/20 1746 02/23/20 2112 02/24/20 0430 02/24/20 0758  WBC 14.3*  --  13.5*  --   --  24.8*  --   NEUTROABS  --   --   --   --   --  22.4*  --   HGB 13.3   < > 14.8 15.6* 15.6* 14.2 15.3*  HCT 41.5   < > 46.1* 46.0 46.0 47.8* 45.0  MCV 92.8  --  91.7  --   --  100.4*  --   PLT 403*  --  374  --   --  397  --    < > = values in this interval not displayed.    Basic Metabolic Panel: Recent Labs  Lab 02/19/20 1652 02/20/20 0935 02/20/20 1644 02/21/20 0704 02/23/20 1746 02/23/20 2112 02/24/20 0430 02/24/20 0758  NA  --   --   --  140 142 142 142 141  K  --   --   --  4.2 5.0 5.0 4.7 4.9  CL  --   --   --  102  --   --  102  --   CO2  --   --   --  26  --   --  26  --   GLUCOSE  --   --   --  113*  --   --  258*  --   BUN  --   --   --  22*  --   --  44*  --   CREATININE  --   --   --  0.67  --   --  1.75*  --   CALCIUM  --   --   --  8.1*  --   --  8.1*  --   MG 2.2 2.2 2.1 2.2  --   --  2.3  --   PHOS 4.2 4.2 3.8 3.6  --   --  7.6*  --    GFR: Estimated Creatinine Clearance: 40.5 mL/min (A) (by C-G formula based on SCr of 1.75 mg/dL (H)). Recent Labs  Lab 02/18/20 0419 02/21/20 0704 02/24/20 0430  WBC 14.3* 13.5* 24.8*    Liver Function Tests: No results for input(s): AST, ALT, ALKPHOS, BILITOT, PROT, ALBUMIN in the last 168 hours. No results for input(s): LIPASE, AMYLASE in the last 168 hours. No results for input(s): AMMONIA in the last 168 hours.  ABG    Component Value Date/Time   PHART 7.119 (LL)  02/24/2020 0758   PCO2ART 92.9 (HH) 02/24/2020 0758   PO2ART 68 (L) 02/24/2020 0758   HCO3 30.1 (H) 02/24/2020 0758   TCO2 33 (H) 02/24/2020 0758   ACIDBASEDEF 2.0 02/24/2020 0758   O2SAT 84.0 02/24/2020 0758     Coagulation Profile: Recent Labs  Lab 02/24/20 0430  INR 1.3*    Cardiac Enzymes: No results for input(s): CKTOTAL, CKMB, CKMBINDEX, TROPONINI in the last 168 hours.  HbA1C: Hgb A1c  MFr Bld  Date/Time Value Ref Range Status  02/19/2020 10:21 PM 7.4 (H) 4.8 - 5.6 % Final    Comment:    (NOTE)         Prediabetes: 5.7 - 6.4         Diabetes: >6.4         Glycemic control for adults with diabetes: <7.0     CBG: Recent Labs  Lab 02/23/20 1536 02/23/20 1921 02/23/20 2303 02/24/20 0335 02/24/20 0817  GLUCAP 176* 151* 134* 258* 259*    Critical care time:  minutes    CRITICAL CARE Performed by: Lesia Sago Uva Runkel   Total critical care time: 45 minutes  Critical care time was exclusive of separately billable procedures and treating other patients.  Critical care was necessary to treat or prevent imminent or life-threatening deterioration.  Critical care was time spent personally by me on the following activities: development of treatment plan with patient and/or surrogate as well as nursing, discussions with consultants, evaluation of patient's response to treatment, examination of patient, obtaining history from patient or surrogate, ordering and performing treatments and interventions, ordering and review of laboratory studies, ordering and review of radiographic studies, pulse oximetry and re-evaluation of patient's condition.

## 2020-02-24 NOTE — Progress Notes (Signed)
PCCM Progress Note:  Worsening hypotension. Vaso added with NE still ~30 mcg/min. Most likely septic shock due to Pna, worsening infiltrates. Does not appear volume overloaded on exam. Central venous sat 82% suggestive of vasodilation. Refractory hypoxemia, due to COVID ARDS, paralyzed, proned. Cautious IVF boluses given hypoxemia. LRCX obtained, Abx for HCAP started.   Plan: S/p 1L LR with mild improvement in BP, addl 1L LR ordered, if O2 stable, plan for 3rd liter LR ( would be 30 cc/kg) MAP > 65 Abx ordered

## 2020-02-24 NOTE — Procedures (Signed)
Central Venous Catheter Insertion Procedure Note  Brianna Velasquez  [id number]  16-Apr-1971  Date:02/24/20  Time:3:42 AM   Provider Performing:Raylei Losurdo T Cyndie Chime   Procedure: Insertion of Non-tunneled Central Venous Catheter(36556) with US guidance (45038)   Indication(s) Medication administration and Difficult access  Consent Unable to obtain consent due to emergent nature of procedure.  Anesthesia none  Timeout Verified patient identification, verified procedure, site/side was marked, verified correct patient position, special equipment/implants available, medications/allergies/relevant history reviewed, required imaging and test results available.  Sterile Technique Maximal sterile technique including full sterile barrier drape, hand hygiene, sterile gown, sterile gloves, mask, hair covering, sterile ultrasound probe cover (if used).  Procedure Description Area of catheter insertion was cleaned with chlorhexidine and draped in sterile fashion.  With real-time ultrasound guidance a central venous catheter was placed into the right internal jugular vein. Nonpulsatile blood flow and easy flushing noted in all ports.  The catheter was sutured in place and sterile dressing applied.  Patient was left prone for the procedure.    Complications/Tolerance None; patient tolerated the procedure well. Chest X-ray is ordered to verify placement for internal jugular or subclavian cannulation.   Chest x-ray is not ordered for femoral cannulation.  EBL Minimal  Specimen(s) None

## 2020-02-24 NOTE — Progress Notes (Signed)
Pt's head turned and arms rotated without complications.

## 2020-02-24 NOTE — Progress Notes (Signed)
ABG drawn, critical values.  Sample sent to the lab, waiting on results.

## 2020-02-24 NOTE — Plan of Care (Signed)
Slowly worsening shock throughout the night.  Started on levophed of 4-->15.  CVC placed, placement needs to be confirmed still.  Patient was left prone for procedure due to significant hypoxia.  Suspicion was more cardiogenic shock from RV dysfunction, lasix tried-did not really respond to 20mg .  Acidosis could also be cause.  Last pH was 7.15 (respiratory acidosis).    Plan: Will obtain repeat blood cultures.   MVO2 sat from CVC Follow up labs this morning, repeat abg to look for acidosis. Follow up cxr.

## 2020-02-24 NOTE — Progress Notes (Signed)
Pt's head turned and arms rotated with no complications.

## 2020-02-24 NOTE — Progress Notes (Signed)
Pt's head turned and arms rotated with no complications.  

## 2020-02-24 NOTE — Progress Notes (Signed)
Pt's head turned and arms rotated at this time.

## 2020-02-25 ENCOUNTER — Inpatient Hospital Stay (HOSPITAL_COMMUNITY): Payer: Medicaid Other

## 2020-02-25 DIAGNOSIS — N170 Acute kidney failure with tubular necrosis: Secondary | ICD-10-CM | POA: Diagnosis not present

## 2020-02-25 DIAGNOSIS — E875 Hyperkalemia: Secondary | ICD-10-CM

## 2020-02-25 DIAGNOSIS — U071 COVID-19: Secondary | ICD-10-CM | POA: Diagnosis not present

## 2020-02-25 DIAGNOSIS — J9601 Acute respiratory failure with hypoxia: Secondary | ICD-10-CM | POA: Diagnosis not present

## 2020-02-25 DIAGNOSIS — J80 Acute respiratory distress syndrome: Secondary | ICD-10-CM | POA: Diagnosis not present

## 2020-02-25 LAB — POCT I-STAT 7, (LYTES, BLD GAS, ICA,H+H)
Acid-base deficit: 8 mmol/L — ABNORMAL HIGH (ref 0.0–2.0)
Acid-base deficit: 7 mmol/L — ABNORMAL HIGH (ref 0.0–2.0)
Acid-base deficit: 6 mmol/L — ABNORMAL HIGH (ref 0.0–2.0)
Bicarbonate: 24.7 mmol/L (ref 20.0–28.0)
Bicarbonate: 24 mmol/L (ref 20.0–28.0)
Bicarbonate: 25 mmol/L (ref 20.0–28.0)
Calcium, Ion: 0.93 mmol/L — ABNORMAL LOW (ref 1.15–1.40)
Calcium, Ion: 0.95 mmol/L — ABNORMAL LOW (ref 1.15–1.40)
Calcium, Ion: 0.93 mmol/L — ABNORMAL LOW (ref 1.15–1.40)
HCT: 39 % (ref 36.0–46.0)
HCT: 38 % (ref 36.0–46.0)
HCT: 35 % — ABNORMAL LOW (ref 36.0–46.0)
Hemoglobin: 13.3 g/dL (ref 12.0–15.0)
Hemoglobin: 12.9 g/dL (ref 12.0–15.0)
Hemoglobin: 11.9 g/dL — ABNORMAL LOW (ref 12.0–15.0)
O2 Saturation: 52 %
O2 Saturation: 64 %
O2 Saturation: 78 %
Patient temperature: 99.4
Potassium: 7 mmol/L (ref 3.5–5.1)
Potassium: 6.8 mmol/L (ref 3.5–5.1)
Potassium: 6.6 mmol/L (ref 3.5–5.1)
Sodium: 135 mmol/L (ref 135–145)
Sodium: 134 mmol/L — ABNORMAL LOW (ref 135–145)
Sodium: 134 mmol/L — ABNORMAL LOW (ref 135–145)
TCO2: 27 mmol/L (ref 22–32)
TCO2: 26 mmol/L (ref 22–32)
TCO2: 27 mmol/L (ref 22–32)
pCO2 arterial: 89.7 mmHg (ref 32.0–48.0)
pCO2 arterial: 78.2 mmHg (ref 32.0–48.0)
pCO2 arterial: 79.7 mmHg (ref 32.0–48.0)
pH, Arterial: 7.051 — CL (ref 7.350–7.450)
pH, Arterial: 7.096 — CL (ref 7.350–7.450)
pH, Arterial: 7.105 — CL (ref 7.350–7.450)
pO2, Arterial: 42 mmHg — ABNORMAL LOW (ref 83.0–108.0)
pO2, Arterial: 46 mmHg — ABNORMAL LOW (ref 83.0–108.0)
pO2, Arterial: 59 mmHg — ABNORMAL LOW (ref 83.0–108.0)

## 2020-02-25 LAB — CBC WITH DIFFERENTIAL/PLATELET
Abs Immature Granulocytes: 0.56 10*3/uL — ABNORMAL HIGH (ref 0.00–0.07)
Basophils Absolute: 0.1 10*3/uL (ref 0.0–0.1)
Basophils Relative: 0 %
Eosinophils Absolute: 0.1 10*3/uL (ref 0.0–0.5)
Eosinophils Relative: 0 %
HCT: 41.9 % (ref 36.0–46.0)
Hemoglobin: 12.3 g/dL (ref 12.0–15.0)
Immature Granulocytes: 2 %
Lymphocytes Relative: 3 %
Lymphs Abs: 0.7 10*3/uL (ref 0.7–4.0)
MCH: 29.4 pg (ref 26.0–34.0)
MCHC: 29.4 g/dL — ABNORMAL LOW (ref 30.0–36.0)
MCV: 100 fL (ref 80.0–100.0)
Monocytes Absolute: 1.3 10*3/uL — ABNORMAL HIGH (ref 0.1–1.0)
Monocytes Relative: 5 %
Neutro Abs: 20.9 10*3/uL — ABNORMAL HIGH (ref 1.7–7.7)
Neutrophils Relative %: 90 %
Platelets: 304 10*3/uL (ref 150–400)
RBC: 4.19 MIL/uL (ref 3.87–5.11)
RDW: 15.1 % (ref 11.5–15.5)
WBC: 23.5 10*3/uL — ABNORMAL HIGH (ref 4.0–10.5)
nRBC: 0.1 % (ref 0.0–0.2)

## 2020-02-25 LAB — BASIC METABOLIC PANEL
Anion gap: 12 (ref 5–15)
BUN: 61 mg/dL — ABNORMAL HIGH (ref 6–20)
CO2: 23 mmol/L (ref 22–32)
Calcium: 7.1 mg/dL — ABNORMAL LOW (ref 8.9–10.3)
Chloride: 102 mmol/L (ref 98–111)
Creatinine, Ser: 3.72 mg/dL — ABNORMAL HIGH (ref 0.44–1.00)
GFR, Estimated: 14 mL/min — ABNORMAL LOW (ref >60)
Glucose, Bld: 251 mg/dL — ABNORMAL HIGH (ref 70–99)
Potassium: 7.2 mmol/L (ref 3.5–5.1)
Sodium: 137 mmol/L (ref 135–145)

## 2020-02-25 LAB — GLUCOSE, CAPILLARY
Glucose-Capillary: 206 mg/dL — ABNORMAL HIGH (ref 70–99)
Glucose-Capillary: 236 mg/dL — ABNORMAL HIGH (ref 70–99)
Glucose-Capillary: 242 mg/dL — ABNORMAL HIGH (ref 70–99)
Glucose-Capillary: 245 mg/dL — ABNORMAL HIGH (ref 70–99)
Glucose-Capillary: 296 mg/dL — ABNORMAL HIGH (ref 70–99)
Glucose-Capillary: 298 mg/dL — ABNORMAL HIGH (ref 70–99)

## 2020-02-25 LAB — PROTIME-INR
INR: 1.5 — ABNORMAL HIGH (ref 0.8–1.2)
Prothrombin Time: 17.5 seconds — ABNORMAL HIGH (ref 11.4–15.2)

## 2020-02-25 LAB — PHOSPHORUS: Phosphorus: 9.4 mg/dL — ABNORMAL HIGH (ref 2.5–4.6)

## 2020-02-25 LAB — MAGNESIUM: Magnesium: 2.3 mg/dL (ref 1.7–2.4)

## 2020-02-25 MED ORDER — SODIUM CHLORIDE 0.9% FLUSH: 10.0000 mL | INTRAVENOUS | Status: DC | PRN

## 2020-02-25 MED ORDER — CALCIUM GLUCONATE 10 % IV SOLN
1.0000 g | Freq: Once | INTRAVENOUS | Status: AC
  Administered 2020-02-25: 1 g via INTRAVENOUS
  Filled 2020-02-25 (×2): qty 10

## 2020-02-25 MED ORDER — DEXTROSE 50 % IV SOLN
1.0000 | Freq: Once | INTRAVENOUS | Status: AC
  Administered 2020-02-25: 50 mL via INTRAVENOUS
  Filled 2020-02-25: qty 50

## 2020-02-25 MED ORDER — SODIUM CHLORIDE 0.9% FLUSH
10.0000 mL | Freq: Two times a day (BID) | INTRAVENOUS | Status: DC
  Administered 2020-02-26: 10 mL

## 2020-02-25 MED ORDER — SODIUM ZIRCONIUM CYCLOSILICATE 10 G PO PACK
10.0000 g | PACK | Freq: Three times a day (TID) | ORAL | Status: DC
  Administered 2020-02-25 – 2020-02-26 (×4): 10 g
  Filled 2020-02-25 (×5): qty 1

## 2020-02-25 MED ORDER — SODIUM CHLORIDE 0.9 % IV SOLN: 2.0000 g | INTRAVENOUS | Status: DC

## 2020-02-25 MED ORDER — SODIUM BICARBONATE 8.4 % IV SOLN
50.0000 meq | Freq: Once | INTRAVENOUS | Status: AC
  Administered 2020-02-25: 50 meq via INTRAVENOUS
  Filled 2020-02-25: qty 50

## 2020-02-25 MED ORDER — INSULIN ASPART 100 UNIT/ML IV SOLN
5.0000 [IU] | Freq: Once | INTRAVENOUS | Status: AC
  Administered 2020-02-25: 5 [IU] via INTRAVENOUS

## 2020-02-25 NOTE — Progress Notes (Signed)
Pt's head turned with no complications.  

## 2020-02-25 NOTE — Progress Notes (Signed)
Pt's head turned with no complications.

## 2020-02-25 NOTE — Progress Notes (Signed)
   02/25/20 1115  Clinical Encounter Type  Visited With Family  Visit Type Patient actively dying  Referral From Nurse  Consult/Referral To Chaplain  Spiritual Encounters  Spiritual Needs Emotional;Prayer  Chaplain responded to the page for the family of Brianna Velasquez.  Pt is actively dying.  Offered comfort, emotional and grief support.  Prayer was given and informed the family if they need me please page me.   Chaplain Cyndia Degraff Morgan-Simpson  [phone]

## 2020-02-25 NOTE — Progress Notes (Addendum)
NAME:  Brianna Velasquez, MRN:  [id number], DOB:  [birth year], LOS: 14 ADMISSION DATE:  March 12, 2020, CONSULTATION DATE: 03-12-2020 REFERRING MD: Dr. Lockie Mola, CHIEF COMPLAINT: Hypoxemic respiratory failure  Brief History   49 year old obese woman, diagnosed with COVID-19 4 days PTA with approximately 1 week of symptoms at presentation.  Found to have hypoxemic respiratory failure, initially on BiPAP, subsequently comfortable but marginal on 30 L/min HF [place] +1.00 mask.   Past Medical History  Renal calculi Hypothyroidism Depression MRSA infection following cesarean section  Significant Hospital Events   Brought to ED on BiPAP, hypoxemic 10/17, admitted ICU level care, cyanotic and intubated 10/29  Consults:    Procedures:  ETT 10/29 IJ TLC 10/30  Significant Diagnostic Tests:  10/19 CXR>>Stable diffuse bilateral lung opacities are noted concerning for multifocal pneumonia. 10/19 Echo>>RV Apical Hyperkinesis relative to the rest of the RV. This can be associated with Pulmonary Embolism, Grade 1 diastolic HF 10/21LE dopplers>>Negative for DVT per Doppler>> Limited Study  Micro Data:  SARSCoV2 10/17 >> positive 10/17 MRSA PCR >> neg 10/17 BCx 2 >> negative  Antimicrobials:  Remdesivir x1 10/17 Baricitinib 10/17 >> Linezolid,   Interim history/subjective:  Worsening hypoxemia. Remains proned and paralyzed. Unable to flip supine. worsening renal failure, hyperkalemia. Can not ventilate. Maxxed on 2 pressors. Family visited at bedside, continue current care, DNR.   Objective   Blood pressure (!) 90/51, pulse (!) 131, temperature 99.8 F (37.7 C), temperature source Axillary, resp. rate (!) 35, height 4' 11.02" (1.499 m), weight 114.5 kg, SpO2 (!) 82 %.    Vent Mode: PRVC FiO2 (%):  [100 %] 100 % Set Rate:  [35 bmp] 35 bmp Vt Set:  [350 mL-400 mL] 400 mL PEEP:  [16 cmH20] 16 cmH20 Plateau Pressure:  [30 cmH20-36 cmH20] 35 cmH20   Intake/Output Summary (Last 24 hours) at  02/25/2020 1803 Last data filed at 02/25/2020 1400 Gross per 24 hour  Intake 4922.54 ml  Output 55 ml  Net 4867.54 ml   Filed Weights   02/23/20 0410 02/24/20 0437 02/25/20 0500  Weight: 106.3 kg 103.8 kg 114.5 kg   General: Middle-aged morbidly obese female, proned Neuro: paralyzed CV: Tach to 140s, NSR, no m/r/g,  PULM: Coarse distant bilateral breath sounds, no wheezes or rhonchi Extremities: warm/dry, 1+ edema , No cyanosis, mottling  Skin: no rashes or lesions   Resolved Hospital Problem list   Hypocalcemia  Assessment & Plan:   Acute hypoxemic respiratory failure due to ARDS from COVID-19 pneumonia Intubated given progressive hypoxemia. Refractory hypoxemia paralyzed and proned. Unable to adequately compensate for resp acidosis given AKI.  --She completed therapy with steroid for 10 days --Continue baricitinib --Continue prone, paralyzed, Vt abnove 6 cc/kg given inability to ventilate  Acute renal failure, oliguric, hyperkalemia: Due to hypoxemia and ATN in setting of hypotension due to refractory acidosis, septic shock. --UOP poor --Not dialysis candidate given worsening respiratory status, would not tolerate CRRT due to progressive hypotension --Lokelma TID  Shock: Due to metabolic disturbances, possible sepsis (leukocytosis, tachycardia) without clear source. Hgb stable on anticoagulation. Central venous sat consistent with septic shock. - vaso, NE, MAP > 65  Sepsis: Unclear source, suspect Pna given worsened CXR. Rising WBC and tachy. No fever.  --add linezolid, cefepime --obtain cultures - gram postiives on gram stain  Suspected pulmonary Embolism IV heparin was switched to apixaban  Steroid-induced hyperglycemia Continue tube feeds Continue SSI, add Levemir 15 units bid  Hypothyroidism Continue synthroid    Best practice:  Diet: Tube feeds  Pain/Anxiety/Delirium protocol (if indicated): low dose Klonopin VAP protocol (if indicated): NA DVT  prophylaxis: Apixaban GI prophylaxis: pepcid Glucose control: SSI  Mobility: progress as tolerated  Code Status: Full  Family Communication: Discussed at length with family (mother, sister, 2 sons) at bedside. Worsening O2 and renal failure. She will not survive. Discussed comfort measures, mother wants to continue current therapy. Will re-assess tomorrow Expressed she has hours to days to live giuven current trajectory. Will not escalate care. Disposition: ICU  Labs   CBC: Recent Labs  Lab 02/21/20 0704 02/23/20 1746 02/24/20 0430 02/24/20 0430 02/24/20 0758 02/25/20 0500 02/25/20 0505 02/25/20 0735 02/25/20 1735  WBC 13.5*  --  24.8*  --   --  23.5*  --   --   --   NEUTROABS  --   --  22.4*  --   --  20.9*  --   --   --   HGB 14.8   < > 14.2   < > 15.3* 12.3 13.3 12.9 11.9*  HCT 46.1*   < > 47.8*   < > 45.0 41.9 39.0 38.0 35.0*  MCV 91.7  --  100.4*  --   --  100.0  --   --   --   PLT 374  --  397  --   --  304  --   --   --    < > = values in this interval not displayed.    Basic Metabolic Panel: Recent Labs  Lab 02/20/20 0935 02/20/20 1644 02/21/20 0704 02/23/20 1746 02/24/20 0430 02/24/20 0430 02/24/20 0758 02/25/20 0500 02/25/20 0505 02/25/20 0735 02/25/20 1735  NA  --   --  140   < > 142   < > 141 137 135 134* 134*  K  --   --  4.2   < > 4.7   < > 4.9 7.2* 7.0* 6.8* 6.6*  CL  --   --  102  --  102  --   --  102  --   --   --   CO2  --   --  26  --  26  --   --  23  --   --   --   GLUCOSE  --   --  113*  --  258*  --   --  251*  --   --   --   BUN  --   --  22*  --  44*  --   --  61*  --   --   --   CREATININE  --   --  0.67  --  1.75*  --   --  3.72*  --   --   --   CALCIUM  --   --  8.1*  --  8.1*  --   --  7.1*  --   --   --   MG 2.2 2.1 2.2  --  2.3  --   --  2.3  --   --   --   PHOS 4.2 3.8 3.6  --  7.6*  --   --  9.4*  --   --   --    < > = values in this interval not displayed.   GFR: Estimated Creatinine Clearance: 20.3 mL/min (A) (by C-G formula  based on SCr of 3.72 mg/dL (H)). Recent Labs  Lab 02/21/20 0704 02/24/20 0430 02/25/20 0500  WBC 13.5* 24.8* 23.5*  Liver Function Tests: No results for input(s): AST, ALT, ALKPHOS, BILITOT, PROT, ALBUMIN in the last 168 hours. No results for input(s): LIPASE, AMYLASE in the last 168 hours. No results for input(s): AMMONIA in the last 168 hours.  ABG    Component Value Date/Time   PHART 7.105 (LL) 02/25/2020 1735   PCO2ART 79.7 (HH) 02/25/2020 1735   PO2ART 59 (L) 02/25/2020 1735   HCO3 25.0 02/25/2020 1735   TCO2 27 02/25/2020 1735   ACIDBASEDEF 6.0 (H) 02/25/2020 1735   O2SAT 78.0 02/25/2020 1735     Coagulation Profile: Recent Labs  Lab 02/24/20 0430 02/25/20 0500  INR 1.3* 1.5*    Cardiac Enzymes: No results for input(s): CKTOTAL, CKMB, CKMBINDEX, TROPONINI in the last 168 hours.  HbA1C: Hgb A1c MFr Bld  Date/Time Value Ref Range Status  02/19/2020 10:21 PM 7.4 (H) 4.8 - 5.6 % Final    Comment:    (NOTE)         Prediabetes: 5.7 - 6.4         Diabetes: >6.4         Glycemic control for adults with diabetes: <7.0     CBG: Recent Labs  Lab 02/25/20 0049 02/25/20 0402 02/25/20 0854 02/25/20 1146 02/25/20 1604  GLUCAP 245* 236* 298* 296* 242*    Critical care time:  minutes    CRITICAL CARE Performed by: Lesia Sago Haylei Cobin   Total critical care time: 45 minutes  Critical care time was exclusive of separately billable procedures and treating other patients.  Critical care was necessary to treat or prevent imminent or life-threatening deterioration.  Critical care was time spent personally by me on the following activities: development of treatment plan with patient and/or surrogate as well as nursing, discussions with consultants, evaluation of patient's response to treatment, examination of patient, obtaining history from patient or surrogate, ordering and performing treatments and interventions, ordering and review of laboratory studies,  ordering and review of radiographic studies, pulse oximetry and re-evaluation of patient's condition.

## 2020-02-25 NOTE — Progress Notes (Signed)
eLink Physician-Brief Progress Note Patient Name: Brianna Velasquez DOB: 01/21/1971 MRN: [id number]   Date of Service  02/25/2020  HPI/Events of Note  Notified of K 7.2, creatinine 3.72 CXR reviewed, no pneumothorax but tipp of ETT at the level of the clavicle  eICU Interventions  Hyperkalemia protocol ordered ETT will need to pushed in by 2 cm. Informed bedside RN     Intervention Category Major Interventions: Electrolyte abnormality - evaluation and management  Irving Burton T Kalen Ratajczak 02/25/2020, 7:00 AM

## 2020-02-25 NOTE — Progress Notes (Signed)
eLink Physician-Brief Progress Note Patient Name: Grasiela Jonsson DOB: [birth year] MRN: [id number]   Date of Service  02/25/2020  HPI/Events of Note  Notified of ABG result 7.05/89/42 On AC 35/350/100/16 PEEP, synchronized on vent Peak pressure 34, plateau pressure 30 MV 12, I:E 1:1.4 Patient maintained on prone position Reportedly sats dropped after head turned. Tube not kinked, able to pass suction catheter, volumes adequate  eICU Interventions  Discussed with RT to increase TV to 400 for now which is 9 cc/kg her IBW Obtain CXR to ensure proper tube position as well as make sure no pneumothorax developed        Darl Pikes 02/25/2020, 5:36 AM

## 2020-02-25 NOTE — Progress Notes (Signed)
Critical ABG results called to E-link RN.  

## 2020-02-26 ENCOUNTER — Inpatient Hospital Stay (HOSPITAL_COMMUNITY): Payer: Medicaid Other

## 2020-02-26 DIAGNOSIS — U071 COVID-19: Secondary | ICD-10-CM | POA: Diagnosis not present

## 2020-02-26 DIAGNOSIS — J80 Acute respiratory distress syndrome: Secondary | ICD-10-CM | POA: Diagnosis not present

## 2020-02-26 DIAGNOSIS — A419 Sepsis, unspecified organism: Secondary | ICD-10-CM

## 2020-02-26 DIAGNOSIS — N170 Acute kidney failure with tubular necrosis: Secondary | ICD-10-CM | POA: Diagnosis not present

## 2020-02-26 DIAGNOSIS — J9601 Acute respiratory failure with hypoxia: Secondary | ICD-10-CM | POA: Diagnosis not present

## 2020-02-26 DIAGNOSIS — R6521 Severe sepsis with septic shock: Secondary | ICD-10-CM

## 2020-02-26 LAB — POCT I-STAT 7, (LYTES, BLD GAS, ICA,H+H)
Acid-base deficit: 6 mmol/L — ABNORMAL HIGH (ref 0.0–2.0)
Bicarbonate: 24.6 mmol/L (ref 20.0–28.0)
Calcium, Ion: 0.9 mmol/L — ABNORMAL LOW (ref 1.15–1.40)
HCT: 34 % — ABNORMAL LOW (ref 36.0–46.0)
Hemoglobin: 11.6 g/dL — ABNORMAL LOW (ref 12.0–15.0)
O2 Saturation: 87 %
Patient temperature: 98.6
Potassium: 6.9 mmol/L (ref 3.5–5.1)
Sodium: 133 mmol/L — ABNORMAL LOW (ref 135–145)
TCO2: 27 mmol/L (ref 22–32)
pCO2 arterial: 72.9 mmHg (ref 32.0–48.0)
pH, Arterial: 7.136 — CL (ref 7.350–7.450)
pO2, Arterial: 72 mmHg — ABNORMAL LOW (ref 83.0–108.0)

## 2020-02-26 LAB — CULTURE, RESPIRATORY W GRAM STAIN

## 2020-02-26 LAB — CBC WITH DIFFERENTIAL/PLATELET
Abs Immature Granulocytes: 0.85 10*3/uL — ABNORMAL HIGH (ref 0.00–0.07)
Basophils Absolute: 0.1 10*3/uL (ref 0.0–0.1)
Basophils Relative: 0 %
Eosinophils Absolute: 0.1 10*3/uL (ref 0.0–0.5)
Eosinophils Relative: 1 %
HCT: 36.7 % (ref 36.0–46.0)
Hemoglobin: 11 g/dL — ABNORMAL LOW (ref 12.0–15.0)
Immature Granulocytes: 5 %
Lymphocytes Relative: 6 %
Lymphs Abs: 1.1 10*3/uL (ref 0.7–4.0)
MCH: 29.3 pg (ref 26.0–34.0)
MCHC: 30 g/dL (ref 30.0–36.0)
MCV: 97.9 fL (ref 80.0–100.0)
Monocytes Absolute: 1.2 10*3/uL — ABNORMAL HIGH (ref 0.1–1.0)
Monocytes Relative: 7 %
Neutro Abs: 13.4 10*3/uL — ABNORMAL HIGH (ref 1.7–7.7)
Neutrophils Relative %: 81 %
Platelets: 284 10*3/uL (ref 150–400)
RBC: 3.75 MIL/uL — ABNORMAL LOW (ref 3.87–5.11)
RDW: 15.6 % — ABNORMAL HIGH (ref 11.5–15.5)
WBC: 16.8 10*3/uL — ABNORMAL HIGH (ref 4.0–10.5)
nRBC: 0.2 % (ref 0.0–0.2)

## 2020-02-26 LAB — BASIC METABOLIC PANEL
Anion gap: 17 — ABNORMAL HIGH (ref 5–15)
BUN: 80 mg/dL — ABNORMAL HIGH (ref 6–20)
CO2: 21 mmol/L — ABNORMAL LOW (ref 22–32)
Calcium: 7 mg/dL — ABNORMAL LOW (ref 8.9–10.3)
Chloride: 98 mmol/L (ref 98–111)
Creatinine, Ser: 5.06 mg/dL — ABNORMAL HIGH (ref 0.44–1.00)
GFR, Estimated: 10 mL/min — ABNORMAL LOW (ref >60)
Glucose, Bld: 274 mg/dL — ABNORMAL HIGH (ref 70–99)
Potassium: 7.3 mmol/L (ref 3.5–5.1)
Sodium: 136 mmol/L (ref 135–145)

## 2020-02-26 LAB — GLUCOSE, CAPILLARY
Glucose-Capillary: 240 mg/dL — ABNORMAL HIGH (ref 70–99)
Glucose-Capillary: 254 mg/dL — ABNORMAL HIGH (ref 70–99)
Glucose-Capillary: 255 mg/dL — ABNORMAL HIGH (ref 70–99)
Glucose-Capillary: 361 mg/dL — ABNORMAL HIGH (ref 70–99)

## 2020-02-26 LAB — PHOSPHORUS: Phosphorus: 10.4 mg/dL — ABNORMAL HIGH (ref 2.5–4.6)

## 2020-02-26 LAB — POTASSIUM: Potassium: 7 mmol/L (ref 3.5–5.1)

## 2020-02-26 LAB — PROTIME-INR
INR: 1.6 — ABNORMAL HIGH (ref 0.8–1.2)
Prothrombin Time: 18.5 seconds — ABNORMAL HIGH (ref 11.4–15.2)

## 2020-02-26 LAB — MAGNESIUM: Magnesium: 2.3 mg/dL (ref 1.7–2.4)

## 2020-02-26 MED ORDER — CEFAZOLIN SODIUM-DEXTROSE 1-4 GM/50ML-% IV SOLN
1.0000 g | Freq: Two times a day (BID) | INTRAVENOUS | Status: DC
  Filled 2020-02-26 (×2): qty 50

## 2020-02-26 MED ORDER — INSULIN ASPART 100 UNIT/ML ~~LOC~~ SOLN
4.0000 [IU] | SUBCUTANEOUS | Status: DC
  Administered 2020-02-26: 4 [IU] via SUBCUTANEOUS

## 2020-02-26 MED ORDER — LACTATED RINGERS IV BOLUS
1000.0000 mL | INTRAVENOUS | Status: AC
  Administered 2020-02-26: 1000 mL via INTRAVENOUS

## 2020-02-26 DEATH — deceased

## 2020-02-29 LAB — CULTURE, BLOOD (ROUTINE X 2)
Culture: NO GROWTH
Culture: NO GROWTH
Special Requests: ADEQUATE
Special Requests: ADEQUATE

## 2020-03-27 NOTE — Progress Notes (Signed)
Pt's head rotated at this time with no complications.

## 2020-03-27 NOTE — Progress Notes (Signed)
Attempted supination of the patient after patient had been prone since 10/29- ETT needed to be safely resecured and CXR obtained to confirm placement. MD called to bedside at 1045 following supination of the patient due to decreasing O2 sats, decreasing BP, and change in HR with a ~6 second pause. LR bolus ordered and administration began. MD updated family over the phone while standing at the bedside around 1050- family made aware of the current situation. Following MD phone call to family, family called RN asking for an update. RN restated what MD had explained. Family then asked for a second opinion from another MD. RN informed the family that the patient was currently in critical condition requiring RN intervention, but would seek guidance on obtaining a second opinion following stabilization of patient. MD notified of this request. MD ordered to re-prone pt. Following prone positioning, patient remained unstable with low O2, low BP, and decreasing HR with multiple pauses. Patient went into PEA. Code status was DNR and TOD called by MD at 11:20 AM.

## 2020-03-27 NOTE — Progress Notes (Signed)
Cortrak Tube Team Note:  Consult received to place a Cortrak feeding tube.   Spoke with patient's RN over the phone. She reports Cortrak order was an error and patient does not need a new Cortrak tube placement. She already has Cortrak tube in place. Will discontinue consult at this time. Please re-consult Cortrak tube team as needed.  If the tube becomes dislodged please keep the tube and contact the Cortrak team at [email or web address] (password [email or web address]) for replacement.  If after hours and replacement cannot be delayed, place a NG tube and confirm placement with an abdominal x-ray.   Felix Pacini, MS, RD, LDN Pager number available on Amion

## 2020-03-27 NOTE — Death Summary Note (Signed)
DEATH SUMMARY   Patient Details  Name: Brianna Velasquez MRN: [id number] DOB: [birth year]  Admission/Discharge Information   Admit Date:  01-Mar-2020  Date of Death: Date of Death: 03/16/2020  Time of Death: Time of Death: 1120  Length of Stay: 2022/08/29  Referring Physician: Pcp, No   Reason(s) for Hospitalization  hypoxemic respiratory failure due to COVID19  Diagnoses  Preliminary cause of death:  Secondary Diagnoses (including complications and co-morbidities):  Active Problems:   ARDS (adult respiratory distress syndrome) (HCC)   Acute respiratory failure with hypoxia (HCC)   Pneumonia due to COVID-19 virus   Hypoxia   Shortness of breath Septic Shock Acute renal failure  Brief Hospital Course (including significant findings, care, treatment, and services provided and events leading to death)  Brianna Velasquez is a 49 y.o. year old female who tested positive for COVID 02/07/20 transferred to [facility] 03/01/20 by EMS with acute hypoxemic respiratory failure. BiPAP in transit. Placed on NRB and HFNC with sats in 80s. Dexamethasone started as well as baracitinib started HD1. Concern for possible PE so started on anticoagulation with heparin gtt subsequently transitioned to Eliquis. She completed a full treatment course of both. Despite aggressive therapy, she continued to have worsened respiratory failure. CXR demonstrated repeatedly bilateral infiltrates consistent with ARDS from COVID. She worsened to the point of desaturations to 50% on HFNC and became BiPAP dependent. She reportedly initially refused intubation on 10/29 but later changed her mind and was intubated the afternoon of 10/29. She had refractory hypoxemia requiring paralysis and proning shortly after intubation. Not an ECMO candidate. She remained proned with worsening gas exchange, pCO2 rising and pO2 declining. Discussed GOC with mother 10/30 and explained that she was likely going to die do to respiratory event and she was  already on the maximal respiratory support and that I did not recommend CPR and drugs after arrest. Mother agreed and patient made DNR 02/24/20. She had worsening gas exchanged and developed septic shock on max dose pressors. Linezolid and cefepime started 10/30. LRCX 10/30 grew MSSA. She had progressive renal failure due to refractory hypoxemia and septic shock. She was not a dialysis candidate given refractory respiratory failure without evidence of volume overload. Aggressive temporizing measures for electrolytes were implemented. Family meeting held 10/31 with mother, sister, 2 sons at bedside. Discussed grave prognosis and likelihood of imminent death. Answered numerous questions. Recommended comfort care but decision was made to continue maximal medical efforts, she remained DNR. The morning of 10/31 there was concern that ETT was not secure. Decision was made to supinate patient (prone ~60 hours with no improvement) in effort to adequately secure ETT. She became more hypoxemic and Bps dropped very low. It was clear she was actively dying. Discussed with mother who wished for [place] to continue maximal medical therapy. Subsequently, she was then re-proned after CXR confirmed ETT in good position. She continued to have low O2 sats and low Bps. She subsequently went into PEA arrest on maximal support including vasopressors and and full ventilatory support. Time of death 11:20 AM. Family notified by MD.    Pertinent Labs and Studies  Significant Diagnostic Studies DG CHEST PORT 1 VIEW  Result Date: 2020/03/16 CLINICAL DATA:  Endotracheal tube present EXAM: PORTABLE CHEST 1 VIEW COMPARISON:  02/25/2020 FINDINGS: Endotracheal tube, partially imaged enteric tube, and right IJ central line are again identified. Persistent diffuse bilateral pulmonary opacities with low lung volumes. Appearance is similar. No significant pleural effusion. No pneumothorax. IMPRESSION: Similar diffuse bilateral pulmonary opacities.  Stable lines and tubes. Electronically Signed   By: Guadlupe Spanish M.D.   On: 03-11-2020 11:02   DG Chest Port 1 View  Result Date: 02/25/2020 CLINICAL DATA:  Hypoxia, COVID EXAM: PORTABLE CHEST 1 VIEW COMPARISON:  02/24/2019 FINDINGS: Endotracheal tube, enteric tube and right IJ central line again identified. Diffuse bilateral pulmonary opacities are again identified. Lung aeration is stable to minimally worsened. No large pleural effusion. No pneumothorax. Stable cardiomediastinal contours. IMPRESSION: 1. Stable to minimally worsened diffuse bilateral pulmonary opacities. 2. Stable lines and tubes. Electronically Signed   By: Guadlupe Spanish M.D.   On: 02/25/2020 08:37   DG CHEST PORT 1 VIEW  Result Date: 02/24/2020 CLINICAL DATA:  Central line placement. EXAM: PORTABLE CHEST 1 VIEW COMPARISON:  02/23/2020 and older studies. FINDINGS: Right internal jugular central venous line tip projects in the mid superior vena cava. Endotracheal tube and enteric feeding tube are stable. Extensive bilateral airspace lung opacities are unchanged from the previous day's exam. No gross pneumothorax on this supine study. IMPRESSION: 1. New right internal jugular central venous line tip projects in the mid superior vena cava. 2. No gross pneumothorax. 3. Extensive bilateral airspace lung opacities, without significant change from the previous day's study. Electronically Signed   By: Amie Portland M.D.   On: 02/24/2020 06:23   DG Chest Port 1 View  Result Date: 02/23/2020 CLINICAL DATA:  49 year old female with hypoxia. EXAM: PORTABLE CHEST 1 VIEW COMPARISON:  Chest radiograph dated 02/23/2020. FINDINGS: Endotracheal tube above the carina and feeding tube extends below the diaphragm with tip beyond the inferior margin of the image. Bilateral interstitial and airspace densities similar to prior radiograph. No pneumothorax. Stable cardiac silhouette. No acute osseous pathology. IMPRESSION: 1. No significant interval  change in the appearance of the lungs compared to prior radiograph. 2. Endotracheal tube above the carina. Electronically Signed   By: Elgie Collard M.D.   On: 02/23/2020 18:43   DG CHEST PORT 1 VIEW  Result Date: 02/23/2020 CLINICAL DATA:  Intubated. EXAM: PORTABLE CHEST 1 VIEW COMPARISON:  02/17/2020 FINDINGS: The endotracheal tube is 4.4 cm above the carina. The feeding tube is coursing down the esophagus and into the stomach. Stable cardiac enlargement and diffuse interstitial and airspace process. No pleural effusions or pneumothorax. IMPRESSION: 1. Stable support apparatus. 2. Stable cardiac enlargement and diffuse interstitial and airspace process. Electronically Signed   By: Rudie Meyer M.D.   On: 02/23/2020 17:15   DG CHEST PORT 1 VIEW  Result Date: 02/17/2020 CLINICAL DATA:  COVID EXAM: PORTABLE CHEST 1 VIEW COMPARISON:  Chest x-rays dated 02/14/2020 and 02/13/2020. FINDINGS: Bilateral ground-glass airspace opacities, mid to lower lobe predominant, stable to slightly improved compared to the previous chest x-ray of 02/14/2020, significantly improved compared to the earlier chest CT of 02/13/2020. No pleural effusion or pneumothorax is seen.  Stable cardiomegaly. IMPRESSION: 1. Improved aeration of both lungs compared to the previous chest x-rays. Persistent ground-glass opacities within each lung, residual multifocal pneumonia versus interstitial edema. 2. Stable cardiomegaly. Electronically Signed   By: Bary Richard M.D.   On: 02/17/2020 08:26   DG Chest Port 1 View  Result Date: 02/14/2020 CLINICAL DATA:  Respiratory distress.  COVID-19 positive EXAM: PORTABLE CHEST 1 VIEW COMPARISON:  February 13, 2020 FINDINGS: There is airspace opacity throughout the lungs bilaterally with slightly less consolidation in the right mid lung compared to 1 day prior. There is cardiomegaly with pulmonary vascularity normal. No adenopathy. No bone lesions. IMPRESSION: Widespread airspace opacity  consistent with multifocal pneumonia. A degree of superimposed pulmonary edema cannot be excluded. There is cardiomegaly with pulmonary vascularity normal. Electronically Signed   By: Bretta Bang III M.D.   On: 02/14/2020 13:51   DG Chest Port 1 View  Result Date: 02/13/2020 CLINICAL DATA:  COVID-19 positive. EXAM: PORTABLE CHEST 1 VIEW COMPARISON:  February 12, 2020. FINDINGS: Stable cardiomegaly. No pneumothorax is noted. Stable diffuse bilateral lung opacities are noted concerning for multifocal pneumonia. Bony thorax is unremarkable. IMPRESSION: Stable diffuse bilateral lung opacities are noted concerning for multifocal pneumonia. Electronically Signed   By: Lupita Raider M.D.   On: 02/13/2020 08:29   Portable chest 1 View  Result Date: 02/12/2020 CLINICAL DATA:  Shortness of breath.  ARDS.  COVID positive. EXAM: PORTABLE CHEST 1 VIEW COMPARISON:  2020-03-09 FINDINGS: Cardiac enlargement. Diffuse bilateral pulmonary infiltrates with air bronchograms. Infiltrates appear progressing since previous study. No pleural effusions. No pneumothorax. IMPRESSION: Progression of bilateral pulmonary infiltrates since previous study. Electronically Signed   By: Burman Nieves M.D.   On: 02/12/2020 06:01   DG Chest Port 1 View  Result Date: 2020-03-09 CLINICAL DATA:  Respiratory distress.  COVID-19 positive EXAM: PORTABLE CHEST 1 VIEW COMPARISON:  July 08, 2018 FINDINGS: There is hazy opacity throughout the lungs at multiple sites, most notably in the mid and lower lung regions bilaterally as well as in the upper lobe regions, somewhat more on the left than on the right. Heart is enlarged with pulmonary vascularity normal. No adenopathy. No bone lesions. There are surgical clips in the thyroid region. IMPRESSION: Extensive multifocal airspace opacity, likely multifocal atypical organism pneumonia. Areas of relative consolidation noted in the lower lung regions and in the right mid lung; there may be a  degree of superimposed bacterial superinfection. Stable cardiac prominence.  No adenopathy evident. Electronically Signed   By: Bretta Bang III M.D.   On: 03-09-20 14:06   DG Abd Portable 1V  Result Date: 02/19/2020 CLINICAL DATA:  Feeding tube placement. EXAM: PORTABLE ABDOMEN - 1 VIEW COMPARISON:  None. FINDINGS: Bilateral ground-glass opacities are seen within the visualized portions of the lungs. A nasogastric tube is seen with its distal tip overlying the expected region of the gastric antrum. The bowel gas pattern is normal. No radio-opaque calculi or other significant radiographic abnormality are seen. IMPRESSION: Nasogastric tube positioning, as described above. Electronically Signed   By: Aram Candela M.D.   On: 02/19/2020 15:37   ECHOCARDIOGRAM COMPLETE  Result Date: 02/13/2020    ECHOCARDIOGRAM REPORT   Patient Name:   REBEKHA DIVELEY Date of Exam: 02/13/2020 Medical Rec #:  [id number]        Height:       59.0 in Accession #:    [id number]       Weight:       252.0 lb Date of Birth:  July 03, 1970        BSA:          2.035 m Patient Age:    49 years         BP:           123/92 mmHg Patient Gender: F                HR:           78 bpm. Exam Location:  Inpatient Procedure: 2D Echo, Cardiac Doppler and Color Doppler Indications:    Dyspnea 786.09 / R06.00  History:        Patient  has no prior history of Echocardiogram examinations.                 COVID-19.  Sonographer:    Elmarie Shiley Dance Referring Phys: [id number] LAURA R GLEASON IMPRESSIONS  1. There is RV Apical Hyperkinesis relative to the rest of the RV. This can be associated with Pulmonary Embolism. Right ventricular systolic function is mildly reduced. The right ventricular size is mildly enlarged.  2. Left ventricular ejection fraction, by estimation, is 60 to 65%. The left ventricle has normal function. The left ventricle has no regional wall motion abnormalities. Left ventricular diastolic parameters are consistent with Grade I  diastolic dysfunction (impaired relaxation).  3. The mitral valve is grossly normal. No evidence of mitral valve regurgitation.  4. The aortic valve was not well visualized. Aortic valve regurgitation is not visualized. Comparison(s): No prior Echocardiogram. FINDINGS  Left Ventricle: Left ventricular ejection fraction, by estimation, is 60 to 65%. The left ventricle has normal function. The left ventricle has no regional wall motion abnormalities. The left ventricular internal cavity size was normal in size. There is  no left ventricular hypertrophy. Left ventricular diastolic parameters are consistent with Grade I diastolic dysfunction (impaired relaxation). Right Ventricle: There is RV Apical Hyperkinesis relative to the rest of the RV. This can be associated with Pulmonary Embolism. The right ventricular size is mildly enlarged. Right vetricular wall thickness was not well visualized. Right ventricular systolic function is mildly reduced. Left Atrium: Left atrial size was normal in size. Right Atrium: Right atrial size was normal in size. Pericardium: There is no evidence of pericardial effusion. Mitral Valve: The mitral valve is grossly normal. No evidence of mitral valve regurgitation. Tricuspid Valve: The tricuspid valve is not well visualized. Tricuspid valve regurgitation is not demonstrated. Aortic Valve: The aortic valve was not well visualized. Aortic valve regurgitation is not visualized. Pulmonic Valve: The pulmonic valve was not well visualized. Pulmonic valve regurgitation is not visualized. Aorta: The aortic root is normal in size and structure and the aortic root was not well visualized. Venous: The pulmonary veins were not well visualized. IAS/Shunts: The atrial septum is grossly normal.  LEFT VENTRICLE PLAX 2D LVIDd:         3.25 cm  Diastology LVIDs:         3.53 cm  LV e' medial:    5.22 cm/s LV PW:         1.02 cm  LV E/e' medial:  15.1 LV IVS:        1.67 cm  LV e' lateral:   8.38 cm/s LVOT  diam:     2.00 cm  LV E/e' lateral: 9.4 LV SV:         40 LV SV Index:   20 LVOT Area:     3.14 cm  RIGHT VENTRICLE             IVC RV Basal diam:  2.93 cm     IVC diam: 1.82 cm RV S prime:     13.10 cm/s TAPSE (M-mode): 1.5 cm LEFT ATRIUM             Index       RIGHT ATRIUM           Index LA diam:        3.70 cm 1.82 cm/m  RA Area:     17.70 cm LA Vol (A2C):   24.6 ml 12.09 ml/m RA Volume:   43.40 ml  21.33 ml/m LA Vol (  A4C):   76.5 ml 37.60 ml/m LA Biplane Vol: 48.0 ml 23.59 ml/m  AORTIC VALVE LVOT Vmax:   73.55 cm/s LVOT Vmean:  47.750 cm/s LVOT VTI:    0.127 m  AORTA Ao Root diam: 2.60 cm Ao Asc diam:  3.70 cm MITRAL VALVE MV Area (PHT): 2.39 cm    SHUNTS MV Decel Time: 317 msec    Systemic VTI:  0.13 m MV E velocity: 79.00 cm/s  Systemic Diam: 2.00 cm MV A velocity: 71.00 cm/s MV E/A ratio:  1.11 Riley Lam MD Electronically signed by Riley Lam MD Signature Date/Time: 02/13/2020/1:20:45 PM    Final    VAS [place] LOWER EXTREMITY VENOUS (DVT)  Result Date: 02/15/2020  Lower Venous DVTStudy Indications: Edema, and d-sign on echo.  Anticoagulation: Heparin. Limitations: Body habitus and poor ultrasound/tissue interface. Comparison Study: No prior studies. Performing Technologist: Jean Rosenthal  Examination Guidelines: A complete evaluation includes B-mode imaging, spectral Doppler, color Doppler, and power Doppler as needed of all accessible portions of each vessel. Bilateral testing is considered an integral part of a complete examination. Limited examinations for reoccurring indications may be performed as noted. The reflux portion of the exam is performed with the patient in reverse Trendelenburg.  +---------+---------------+---------+-----------+----------+--------------+ RIGHT    CompressibilityPhasicitySpontaneityPropertiesThrombus Aging +---------+---------------+---------+-----------+----------+--------------+ CFV                     Yes      Yes                                  +---------+---------------+---------+-----------+----------+--------------+ SFJ                     Yes      Yes                                 +---------+---------------+---------+-----------+----------+--------------+ FV Prox  Full           Yes      Yes                                 +---------+---------------+---------+-----------+----------+--------------+ FV Mid   Full                                                        +---------+---------------+---------+-----------+----------+--------------+ FV DistalFull           Yes      Yes                                 +---------+---------------+---------+-----------+----------+--------------+ PFV      Full                                                        +---------+---------------+---------+-----------+----------+--------------+ POP      Full           Yes      Yes                                 +---------+---------------+---------+-----------+----------+--------------+  PTV      Full                                                        +---------+---------------+---------+-----------+----------+--------------+ PERO     Full                                                        +---------+---------------+---------+-----------+----------+--------------+ Gastroc  Full                                                        +---------+---------------+---------+-----------+----------+--------------+   +---------+---------------+---------+-----------+----------+--------------+ LEFT     CompressibilityPhasicitySpontaneityPropertiesThrombus Aging +---------+---------------+---------+-----------+----------+--------------+ CFV                     Yes      Yes                                 +---------+---------------+---------+-----------+----------+--------------+ SFJ                     Yes      Yes                                  +---------+---------------+---------+-----------+----------+--------------+ FV Prox  Full                                                        +---------+---------------+---------+-----------+----------+--------------+ FV Mid   Full                                                        +---------+---------------+---------+-----------+----------+--------------+ FV Distal               Yes      Yes                                 +---------+---------------+---------+-----------+----------+--------------+ PFV                                                   Not visualized +---------+---------------+---------+-----------+----------+--------------+ POP      Full           Yes      Yes                                 +---------+---------------+---------+-----------+----------+--------------+  PTV      Full                                                        +---------+---------------+---------+-----------+----------+--------------+ PERO     Full                                                        +---------+---------------+---------+-----------+----------+--------------+     Summary: RIGHT: - There is no evidence of deep vein thrombosis in the lower extremity. However, portions of this examination were limited- see technologist comments above.  - No cystic structure found in the popliteal fossa.  LEFT: - There is no evidence of deep vein thrombosis in the lower extremity. However, portions of this examination were limited- see technologist comments above.  - No cystic structure found in the popliteal fossa.  *See table(s) above for measurements and observations. Electronically signed by Lemar Livings MD on 02/15/2020 at 4:08:34 PM.    Final     Microbiology Recent Results (from the past 240 hour(s))  Culture, respiratory (non-expectorated)     Status: None   Collection Time: 02/24/20 12:34 PM   Specimen: Tracheal Aspirate; Respiratory  Result Value Ref Range  Status   Specimen Description TRACHEAL ASPIRATE  Final   Special Requests NONE  Final   Gram Stain   Final    RARE WBC PRESENT,BOTH PMN AND MONONUCLEAR FEW GRAM POSITIVE COCCI IN PAIRS FEW GRAM POSITIVE RODS Performed at [facility], [address], [place], [place] [address]    Culture ABUNDANT STAPHYLOCOCCUS AUREUS  Final   Report Status 03-22-2020 FINAL  Final   Organism ID, Bacteria STAPHYLOCOCCUS AUREUS  Final      Susceptibility   Staphylococcus aureus - MIC*    CIPROFLOXACIN <=0.5 SENSITIVE Sensitive     ERYTHROMYCIN >=8 RESISTANT Resistant     GENTAMICIN <=0.5 SENSITIVE Sensitive     OXACILLIN 0.5 SENSITIVE Sensitive     TETRACYCLINE <=1 SENSITIVE Sensitive     VANCOMYCIN 1 SENSITIVE Sensitive     TRIMETH/SULFA <=10 SENSITIVE Sensitive     CLINDAMYCIN <=0.25 SENSITIVE Sensitive     RIFAMPIN <=0.5 SENSITIVE Sensitive     Inducible Clindamycin NEGATIVE Sensitive     * ABUNDANT STAPHYLOCOCCUS AUREUS  Culture, blood (routine x 2)     Status: None (Preliminary result)   Collection Time: 02/24/20  6:17 PM   Specimen: BLOOD RIGHT ARM  Result Value Ref Range Status   Specimen Description BLOOD RIGHT ARM  Final   Special Requests   Final    BOTTLES DRAWN AEROBIC AND ANAEROBIC Blood Culture adequate volume   Culture   Final    NO GROWTH 2 DAYS Performed at [facility], [address], [place], [place] [address]    Report Status PENDING  Incomplete  Culture, blood (routine x 2)     Status: None (Preliminary result)   Collection Time: 02/24/20  9:57 PM   Specimen: BLOOD  Result Value Ref Range Status   Specimen Description BLOOD SITE NOT SPECIFIED  Final   Special Requests   Final    BOTTLES DRAWN AEROBIC AND ANAEROBIC Blood Culture  adequate volume   Culture   Final    NO GROWTH 2 DAYS Performed at [facility], [address] [place], [place] [address]    Report Status PENDING  Incomplete    Lab Basic Metabolic Panel: Recent Labs  Lab  02/20/20 1644 02/21/20 0704 02/23/20 1746 02/24/20 0430 02/24/20 0758 02/25/20 0500 02/25/20 0500 02/25/20 0505 02/25/20 0505 02/25/20 0735 02/25/20 1735 03-17-2020 0159 2020/03/17 0327 03/17/20 0740  NA  --  140   < > 142   < > 137   < > 135  --  134* 134*  --  133* 136  K  --  4.2   < > 4.7   < > 7.2*   < > 7.0*   < > 6.8* 6.6* 7.0* 6.9* 7.3*  CL  --  102  --  102  --  102  --   --   --   --   --   --   --  98  CO2  --  26  --  26  --  23  --   --   --   --   --   --   --  21*  GLUCOSE  --  113*  --  258*  --  251*  --   --   --   --   --   --   --  274*  BUN  --  22*  --  44*  --  61*  --   --   --   --   --   --   --  80*  CREATININE  --  0.67  --  1.75*  --  3.72*  --   --   --   --   --   --   --  5.06*  CALCIUM  --  8.1*  --  8.1*  --  7.1*  --   --   --   --   --   --   --  7.0*  MG 2.1 2.2  --  2.3  --  2.3  --   --   --   --   --   --   --  2.3  PHOS 3.8 3.6  --  7.6*  --  9.4*  --   --   --   --   --   --   --  10.4*   < > = values in this interval not displayed.   Liver Function Tests: No results for input(s): AST, ALT, ALKPHOS, BILITOT, PROT, ALBUMIN in the last 168 hours. No results for input(s): LIPASE, AMYLASE in the last 168 hours. No results for input(s): AMMONIA in the last 168 hours. CBC: Recent Labs  Lab 02/21/20 0704 02/23/20 1746 02/24/20 0430 02/24/20 0758 02/25/20 0500 02/25/20 0500 02/25/20 0505 02/25/20 0735 02/25/20 1735 03-17-2020 0327 2020-03-17 0740  WBC 13.5*  --  24.8*  --  23.5*  --   --   --   --   --  16.8*  NEUTROABS  --   --  22.4*  --  20.9*  --   --   --   --   --  13.4*  HGB 14.8   < > 14.2   < > 12.3   < > 13.3 12.9 11.9* 11.6* 11.0*  HCT 46.1*   < > 47.8*   < > 41.9   < > 39.0 38.0 35.0* 34.0* 36.7  MCV 91.7  --  100.4*  --  100.0  --   --   --   --   --  97.9  PLT 374  --  397  --  304  --   --   --   --   --  284   < > = values in this interval not displayed.   Cardiac Enzymes: No results for input(s): CKTOTAL, CKMB,  CKMBINDEX, TROPONINI in the last 168 hours. Sepsis Labs: Recent Labs  Lab 02/21/20 0704 02/24/20 0430 02/25/20 0500 03/06/2020 0740  WBC 13.5* 24.8* 23.5* 16.8*    Procedures/Operations  IJ TLC, art line, ETT  Lesia SagoMatthew R Wael Maestas 03/02/2020, 11:52 AM

## 2020-03-27 NOTE — Progress Notes (Signed)
PCCM Progress Note  Concerned that ET Tube was not secure as she has been proned for well over 48 hours and taping was very loose. She was supinated and CXR obtained. ET Tube secured. No PTX, dense infiltrates consistent with ongoing COVID ARDS, ET Tube in good position. She was noted to drop her blood pressure to 40s over 20 and O2 sat in 50s. She had ~6 second sinus pause then resumed various heart rhythms. After several minutes BP stabilized MAPS in 50s. Spo2 in 70s. A liter bolus LR ordered. Decision made to prone patient in effort to provide maximal medical support. Mother, Brianna Velasquez, was notified of this and the imminent likelihood of her dying at any time. After prone, her blood pressure and O2 sat remained poor. She had a several second sinus pause. She regained electrical activity. She had a third pause and subsequent PEA arrest on max dose vasopressors and full ventilator support. Notably, she was DNR per discussion with mother, Brianna Velasquez, on 02/24/20 as documented in same day progress note. Time of death 11:20 AM. Family notified. Autopsy declined. They will call unit if they decide to visit.

## 2020-03-27 NOTE — Progress Notes (Signed)
Assisted family with tele-visit via elink 

## 2020-03-27 NOTE — Progress Notes (Signed)
eLink Physician-Brief Progress Note Patient Name: Jenayah Antu DOB: 11-Apr-1971 MRN: [id number]   Date of Service  03/17/2020  HPI/Events of Note  Low ionized calcium (0.90) but very elevated phosphorus level. Calcium x Phos product > 60.  K 6.9 (~ same / lower compared to prior, already on Lokelma TID)   eICU Interventions  Continue current management. No repletion of calcium indicated given Ca x Phos product.     Intervention Category Intermediate Interventions: Electrolyte abnormality - evaluation and management  Janae Bridgeman 03/24/2020, 4:37 AM

## 2020-03-27 NOTE — Progress Notes (Signed)
NAME:  Brianna Velasquez, MRN:  [id number], DOB:  08/27/1970, LOS: 15 ADMISSION DATE:  03-08-20, CONSULTATION DATE: 08-Mar-2020 REFERRING MD: Dr. Lockie Mola, CHIEF COMPLAINT: Hypoxemic respiratory failure  Brief History   49 year old obese woman, diagnosed with COVID-19 4 days PTA with approximately 1 week of symptoms at presentation.  Found to have hypoxemic respiratory failure, initially on BiPAP, subsequently comfortable but marginal on 30 L/min HF [place] +1.00 mask.   Past Medical History  Renal calculi Hypothyroidism Depression MRSA infection following cesarean section  Significant Hospital Events   Brought to ED on BiPAP, hypoxemic 10/17, admitted ICU level care, cyanotic and intubated 10/29  Consults:    Procedures:  ETT 10/29 IJ TLC 10/30  Significant Diagnostic Tests:  10/19 CXR>>Stable diffuse bilateral lung opacities are noted concerning for multifocal pneumonia. 10/19 Echo>>RV Apical Hyperkinesis relative to the rest of the RV. This can be associated with Pulmonary Embolism, Grade 1 diastolic HF 10/21LE dopplers>>Negative for DVT per Doppler>> Limited Study  Micro Data:  SARSCoV2 10/17 >> positive 10/17 MRSA PCR >> neg 10/17 BCx 2 >> negative  Antimicrobials:  Remdesivir x1 10/17 Baricitinib 10/17 >> Linezolid,   Interim history/subjective:  NAEON ,stable O2. Worsening facial swelling, plan to trial supine. Cr worsening, K rising  Objective   Blood pressure 106/60, pulse (!) 122, temperature 98 F (36.7 C), temperature source Axillary, resp. rate (!) 35, height 4' 11.02" (1.499 m), weight 114.5 kg, SpO2 92 %.    Vent Mode: PRVC FiO2 (%):  [90 %-100 %] 90 % Set Rate:  [35 bmp] 35 bmp Vt Set:  [400 mL] 400 mL PEEP:  [16 cmH20] 16 cmH20 Plateau Pressure:  [35 cmH20-36 cmH20] 35 cmH20   Intake/Output Summary (Last 24 hours) at 03/09/2020 0911 Last data filed at 03/26/2020 0800 Gross per 24 hour  Intake 4051.4 ml  Output 95 ml  Net 3956.4 ml   Filed  Weights   02/23/20 0410 02/24/20 0437 02/25/20 0500  Weight: 106.3 kg 103.8 kg 114.5 kg   General: Middle-aged morbidly obese female, proned Neuro: paralyzed CV: Tach to 140s, NSR, no m/r/g,  PULM: Coarse distant bilateral breath sounds, no wheezes or rhonchi Extremities: warm/dry, 1+ edema , No cyanosis, mottling  Skin: no rashes or lesions   Resolved Hospital Problem list   Hypocalcemia  Assessment & Plan:   Acute hypoxemic respiratory failure due to ARDS from COVID-19 pneumonia Intubated given progressive hypoxemia. Refractory hypoxemia paralyzed and proned. Unable to adequately compensate for resp acidosis given AKI.  --She completed therapy with steroid for 10 days --Continue baricitinib --Continue prone, paralyzed, Vt abnove 6 cc/kg given inability to ventilate  Acute renal failure, oliguric, hyperkalemia: Due to hypoxemia and ATN in setting of hypotension due to refractory acidosis, septic shock. --UOP poor --Not dialysis candidate given worsening respiratory status, would not tolerate CRRT due to progressive hypotension --Lokelma TID x 48 hours ends tonight, K rising despite this  Septic Shock: Due to metabolic disturbances, sepsis from MSSA HCAP.Marland Kitchen Hgb stable on anticoagulation. Central venous sat consistent with septic shock. - vaso, NE, MAP > 65 --linezolid, can narrow based on sensitivities  Suspected pulmonary Embolism IV heparin was switched to apixaban  Steroid-induced hyperglycemia Continue tube feeds Continue SSI, add Levemir 15 units bid  Hypothyroidism Continue synthroid    Best practice:  Diet: Tube feeds Pain/Anxiety/Delirium protocol (if indicated): sedated VAP protocol (if indicated): NA DVT prophylaxis: Apixaban GI prophylaxis: pepcid Glucose control: SSI  Mobility: progress as tolerated  Code Status: Full  Family Communication: as  able Disposition: ICU  Labs   CBC: Recent Labs  Lab 02/21/20 0704 02/23/20 1746 02/24/20 0430  02/24/20 0758 02/25/20 0500 02/25/20 0500 02/25/20 0505 02/25/20 0735 02/25/20 1735 March 13, 2020 0327 03-13-20 0740  WBC 13.5*  --  24.8*  --  23.5*  --   --   --   --   --  16.8*  NEUTROABS  --   --  22.4*  --  20.9*  --   --   --   --   --  13.4*  HGB 14.8   < > 14.2   < > 12.3   < > 13.3 12.9 11.9* 11.6* 11.0*  HCT 46.1*   < > 47.8*   < > 41.9   < > 39.0 38.0 35.0* 34.0* 36.7  MCV 91.7  --  100.4*  --  100.0  --   --   --   --   --  97.9  PLT 374  --  397  --  304  --   --   --   --   --  284   < > = values in this interval not displayed.    Basic Metabolic Panel: Recent Labs  Lab 02/20/20 1644 02/21/20 0704 02/23/20 1746 02/24/20 0430 02/24/20 0758 02/25/20 0500 02/25/20 0500 02/25/20 0505 02/25/20 0505 02/25/20 0735 02/25/20 1735 2020-03-13 0159 13-Mar-2020 0327 03/13/2020 0740  NA  --  140   < > 142   < > 137   < > 135  --  134* 134*  --  133* 136  K  --  4.2   < > 4.7   < > 7.2*   < > 7.0*   < > 6.8* 6.6* 7.0* 6.9* 7.3*  CL  --  102  --  102  --  102  --   --   --   --   --   --   --  98  CO2  --  26  --  26  --  23  --   --   --   --   --   --   --  21*  GLUCOSE  --  113*  --  258*  --  251*  --   --   --   --   --   --   --  274*  BUN  --  22*  --  44*  --  61*  --   --   --   --   --   --   --  80*  CREATININE  --  0.67  --  1.75*  --  3.72*  --   --   --   --   --   --   --  5.06*  CALCIUM  --  8.1*  --  8.1*  --  7.1*  --   --   --   --   --   --   --  7.0*  MG 2.1 2.2  --  2.3  --  2.3  --   --   --   --   --   --   --  2.3  PHOS 3.8 3.6  --  7.6*  --  9.4*  --   --   --   --   --   --   --  10.4*   < > = values in this interval not displayed.   GFR: Estimated Creatinine Clearance:  14.9 mL/min (A) (by C-G formula based on SCr of 5.06 mg/dL (H)). Recent Labs  Lab 02/21/20 0704 02/24/20 0430 02/25/20 0500 02/27/2020 0740  WBC 13.5* 24.8* 23.5* 16.8*    Liver Function Tests: No results for input(s): AST, ALT, ALKPHOS, BILITOT, PROT, ALBUMIN in the last 168  hours. No results for input(s): LIPASE, AMYLASE in the last 168 hours. No results for input(s): AMMONIA in the last 168 hours.  ABG    Component Value Date/Time   PHART 7.136 (LL) 03/22/2020 0327   PCO2ART 72.9 (HH) 03/21/2020 0327   PO2ART 72 (L) 03/06/2020 0327   HCO3 24.6 03/18/2020 0327   TCO2 27 03/22/2020 0327   ACIDBASEDEF 6.0 (H) 03/09/2020 0327   O2SAT 87.0 03/17/2020 0327     Coagulation Profile: Recent Labs  Lab 02/24/20 0430 02/25/20 0500 03/02/2020 0740  INR 1.3* 1.5* 1.6*    Cardiac Enzymes: No results for input(s): CKTOTAL, CKMB, CKMBINDEX, TROPONINI in the last 168 hours.  HbA1C: Hgb A1c MFr Bld  Date/Time Value Ref Range Status  02/19/2020 10:21 PM 7.4 (H) 4.8 - 5.6 % Final    Comment:    (NOTE)         Prediabetes: 5.7 - 6.4         Diabetes: >6.4         Glycemic control for adults with diabetes: <7.0     CBG: Recent Labs  Lab 02/25/20 1604 02/25/20 1946 03/11/2020 0048 03/04/2020 0328 03/24/2020 0710  GLUCAP 242* 206* 254* 255* 240*    Critical care time:  minutes    CRITICAL CARE Performed by: Lesia Sago Jaycob Mcclenton   Total critical care time: 40 minutes  Critical care time was exclusive of separately billable procedures and treating other patients.  Critical care was necessary to treat or prevent imminent or life-threatening deterioration.  Critical care was time spent personally by me on the following activities: development of treatment plan with patient and/or surrogate as well as nursing, discussions with consultants, evaluation of patient's response to treatment, examination of patient, obtaining history from patient or surrogate, ordering and performing treatments and interventions, ordering and review of laboratory studies, ordering and review of radiographic studies, pulse oximetry and re-evaluation of patient's condition.

## 2020-03-27 DEATH — deceased

## 2022-01-22 IMAGING — DX DG CHEST 1V PORT
1 series · 1 of 1 positions shown · non-contrast
Comparison: 02/11/2020

CLINICAL DATA: Shortness of breath.  ARDS.  COVID positive.

EXAM:
PORTABLE CHEST 1 VIEW

[chest ap]
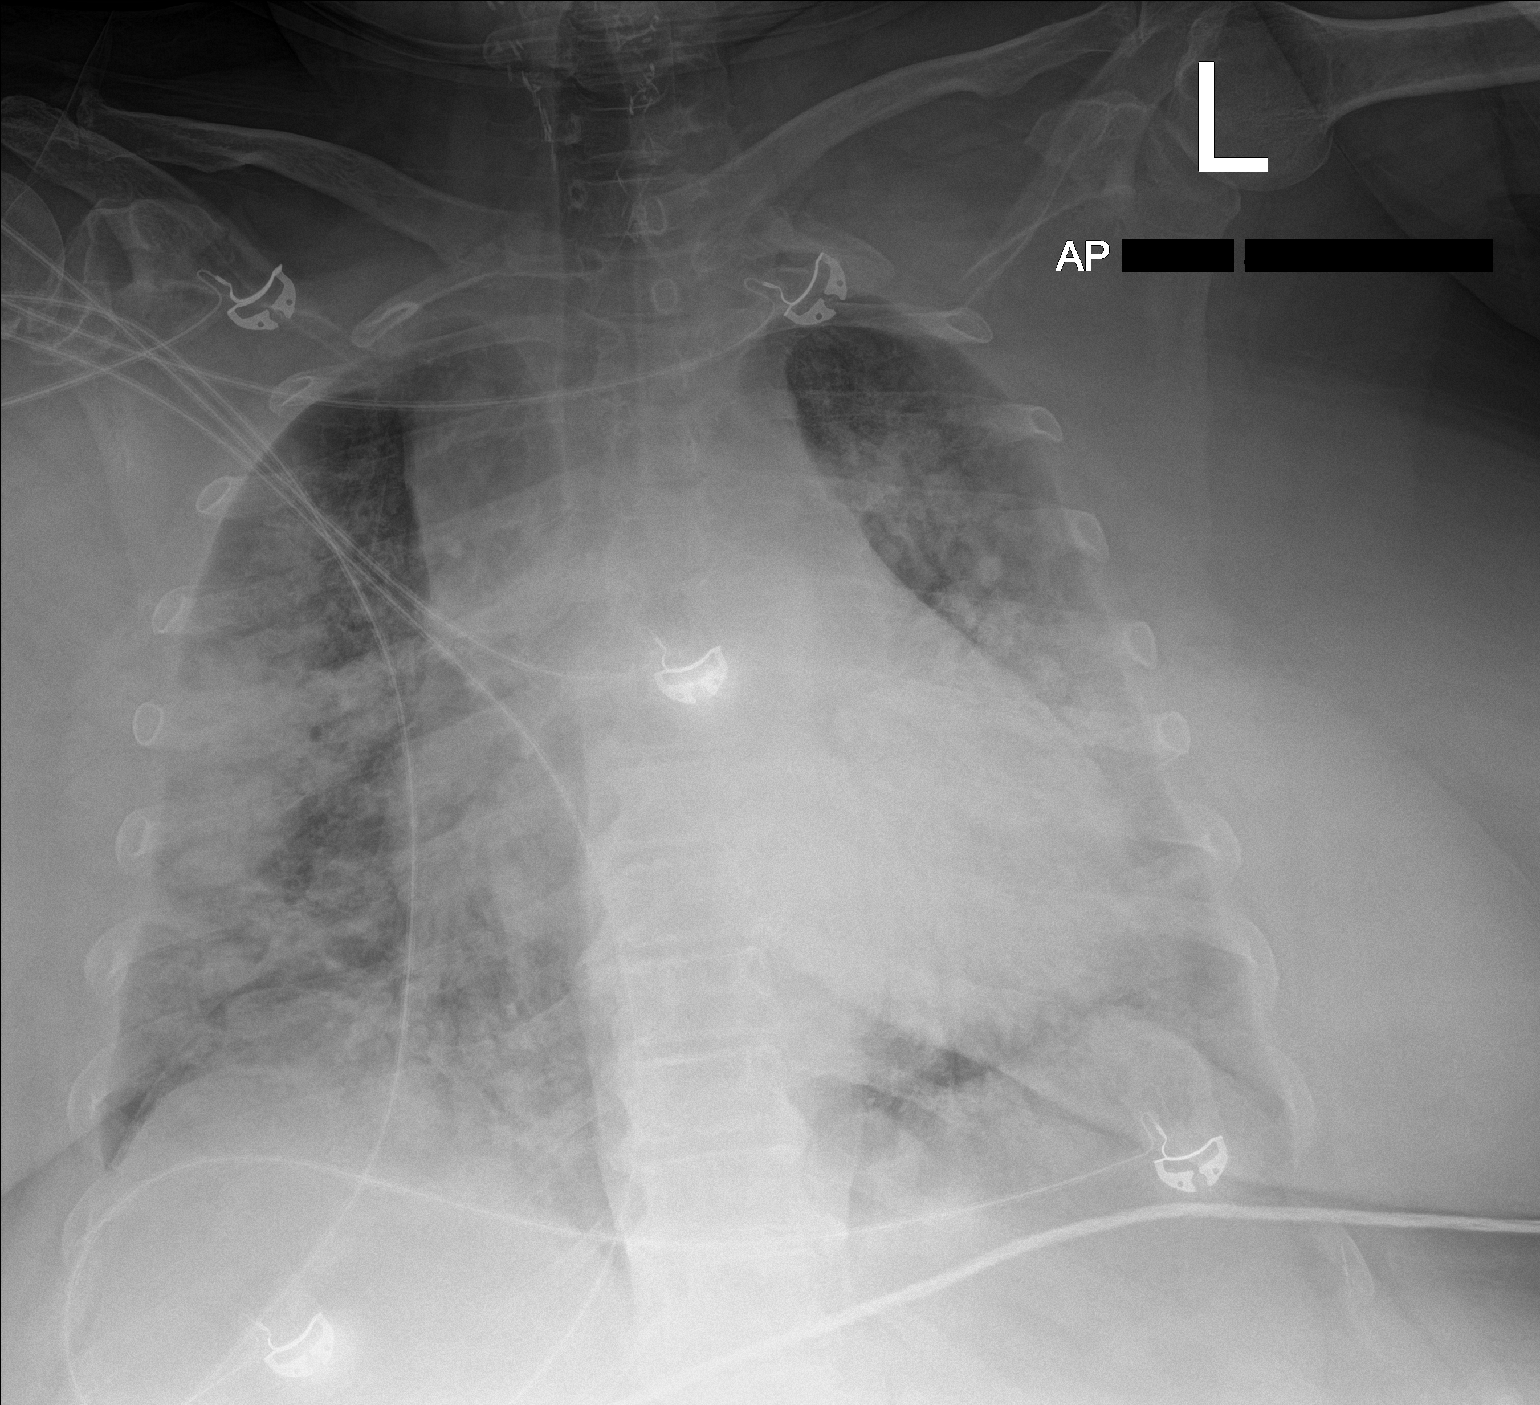

[1 of 1 positions shown; findings below may reference images not displayed]

FINDINGS: Cardiac enlargement. Diffuse bilateral pulmonary infiltrates with
air bronchograms. Infiltrates appear progressing since previous
study. No pleural effusions. No pneumothorax.
IMPRESSION: Progression of bilateral pulmonary infiltrates since previous study.

## 2022-01-23 IMAGING — DX DG CHEST 1V PORT
1 series · 1 of 1 positions shown · non-contrast
Comparison: February 12, 2020.

CLINICAL DATA: [id number] positive.

EXAM:
PORTABLE CHEST 1 VIEW

[chest]
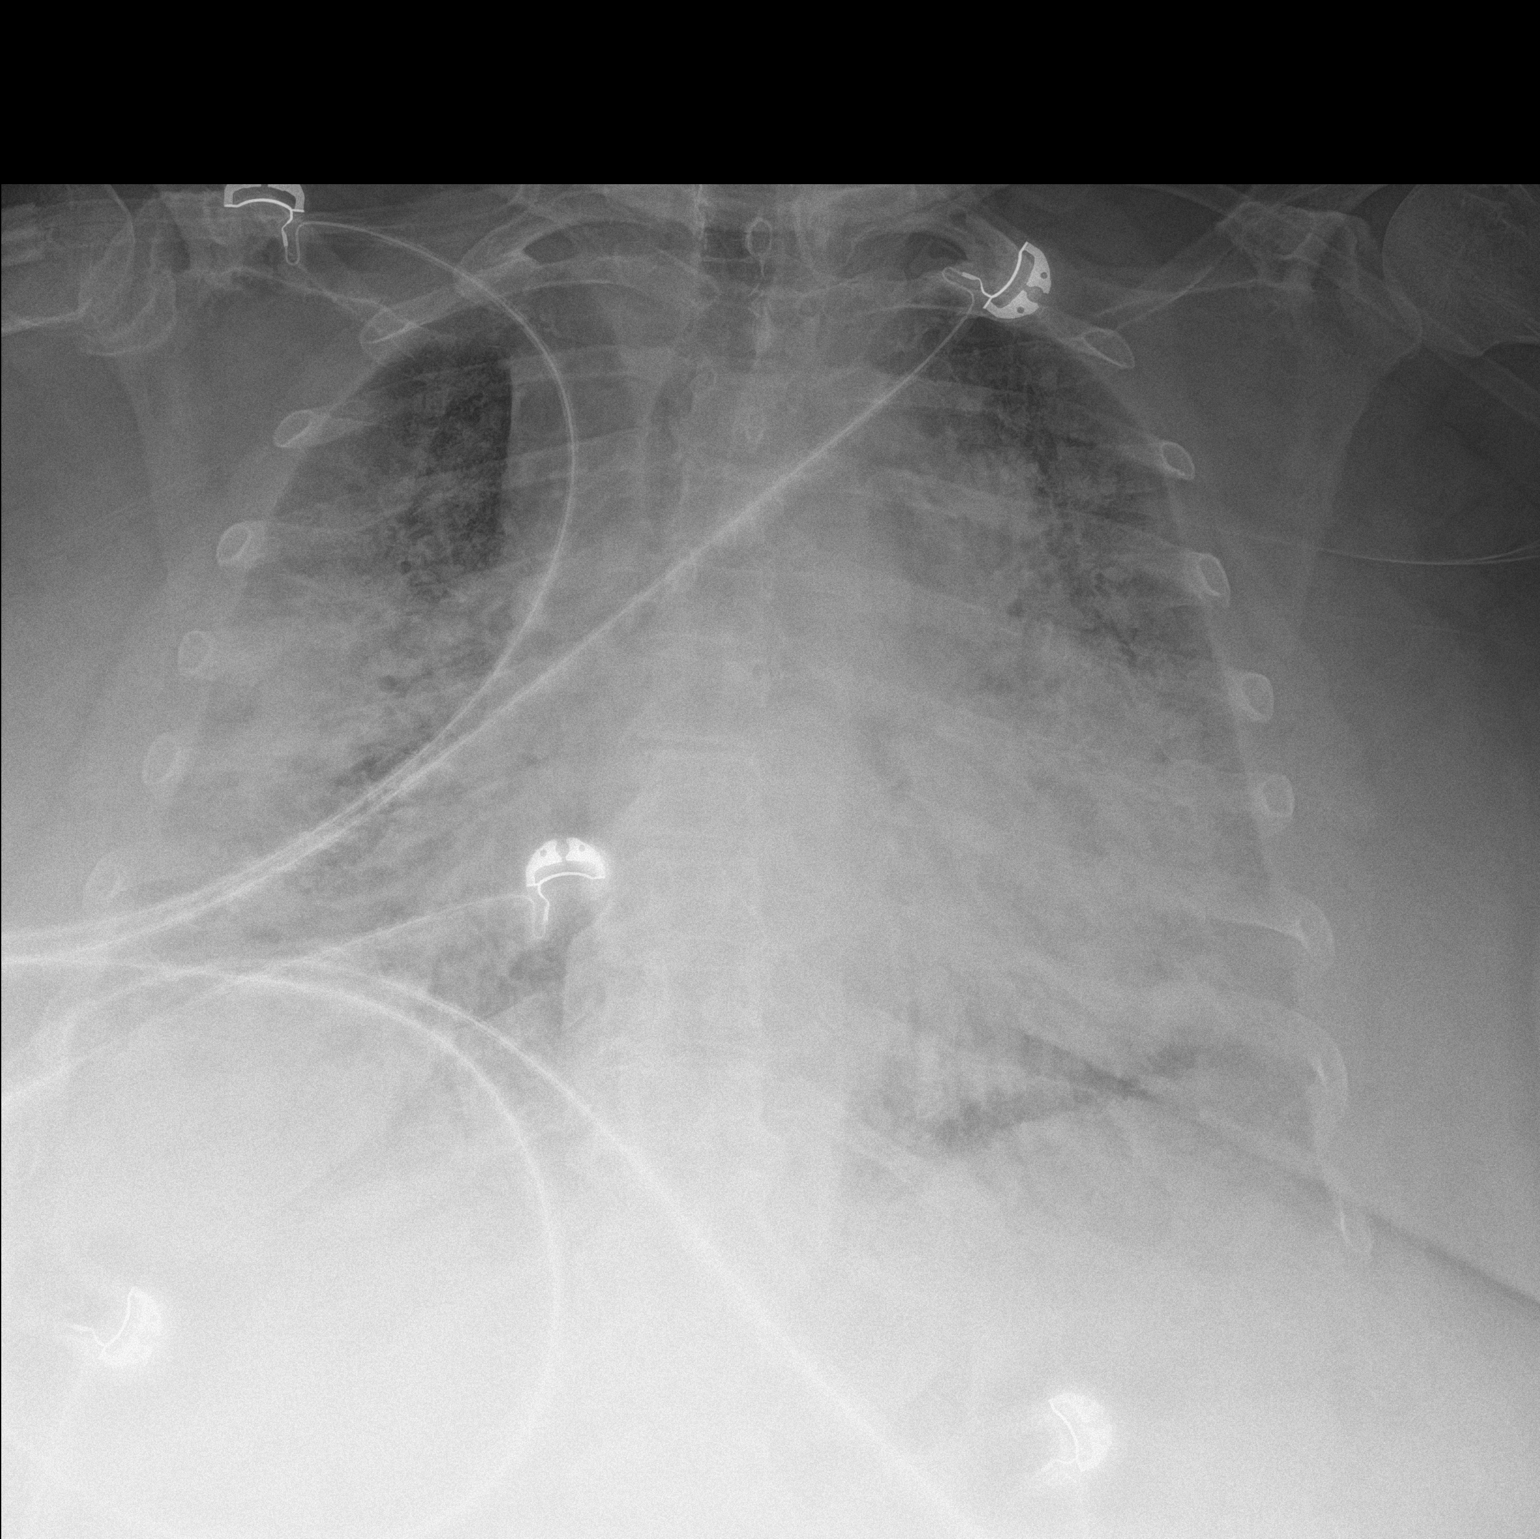

[1 of 1 positions shown; findings below may reference images not displayed]

FINDINGS: Stable cardiomegaly. No pneumothorax is noted. Stable diffuse
bilateral lung opacities are noted concerning for multifocal
pneumonia. Bony thorax is unremarkable.
IMPRESSION: Stable diffuse bilateral lung opacities are noted concerning for
multifocal pneumonia.

## 2022-01-24 IMAGING — DX DG CHEST 1V PORT
1 series · 1 of 1 positions shown · non-contrast
Comparison: February 13, 2020

CLINICAL DATA: Respiratory distress.  [id number] positive

EXAM:
PORTABLE CHEST 1 VIEW

[chest]
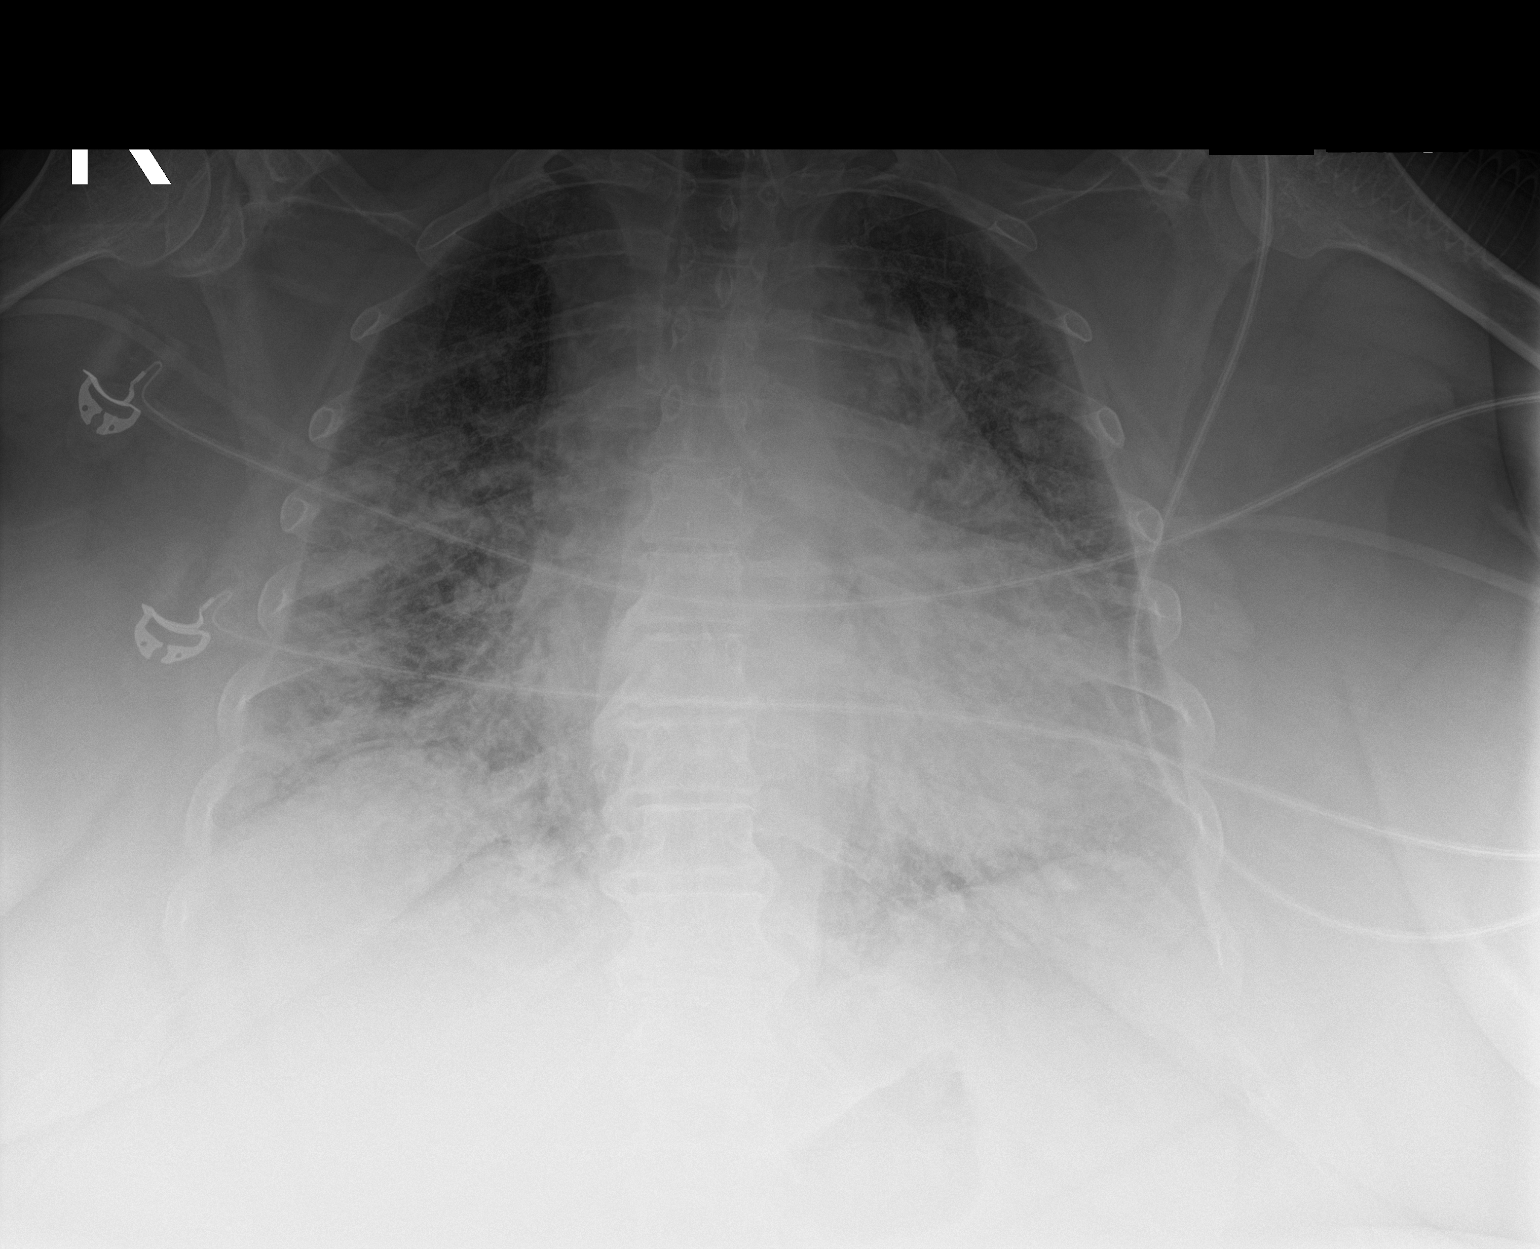

[1 of 1 positions shown; findings below may reference images not displayed]

FINDINGS: There is airspace opacity throughout the lungs bilaterally with
slightly less consolidation in the right mid lung compared to 1 day
prior. There is cardiomegaly with pulmonary vascularity normal. No
adenopathy. No bone lesions.
IMPRESSION: Widespread airspace opacity consistent with multifocal pneumonia. A
degree of superimposed pulmonary edema cannot be excluded. There is
cardiomegaly with pulmonary vascularity normal.

## 2022-01-29 IMAGING — DX DG ABD PORTABLE 1V
1 series · 1 of 1 positions shown · non-contrast
Comparison: None.

CLINICAL DATA: Feeding tube placement.

EXAM:
PORTABLE ABDOMEN - 1 VIEW

[abdomen kub]
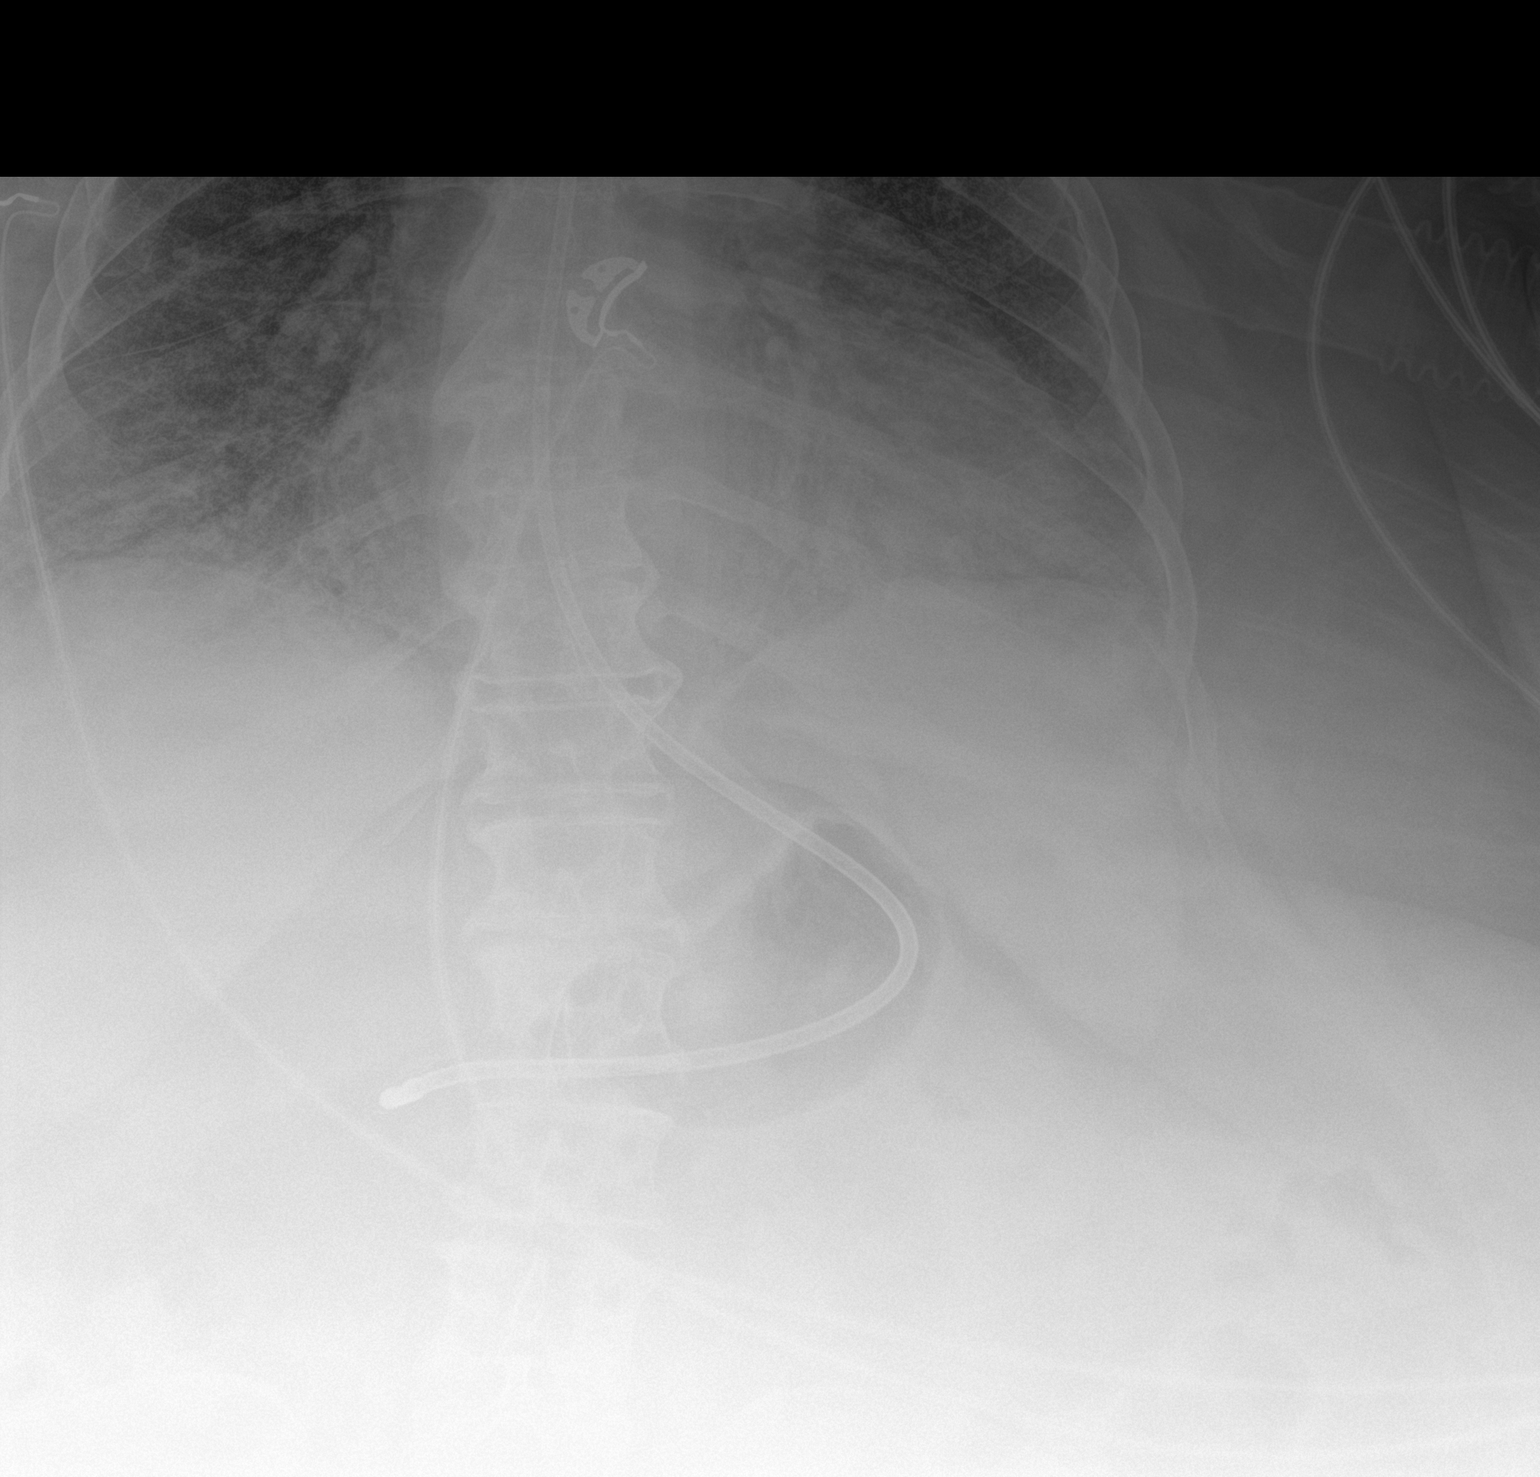

[1 of 1 positions shown; findings below may reference images not displayed]

FINDINGS: Bilateral ground-glass opacities are seen within the visualized
portions of the lungs. A nasogastric tube is seen with its distal
tip overlying the expected region of the gastric antrum. The bowel
gas pattern is normal. No radio-opaque calculi or other significant
radiographic abnormality are seen.
IMPRESSION: Nasogastric tube positioning, as described above.
